# Patient Record
Sex: Female | Born: 1990
Health system: Southern US, Community
[De-identification: ages and names within clinical notes are randomized; demographics above are authoritative.]

## PROBLEM LIST (undated history)

## (undated) DIAGNOSIS — E119 Type 2 diabetes mellitus without complications: Secondary | ICD-10-CM

## (undated) DIAGNOSIS — I10 Essential (primary) hypertension: Secondary | ICD-10-CM

## (undated) HISTORY — PX: EYE SURGERY: SHX253

## (undated) HISTORY — PX: LEG SURGERY: SHX1003

## (undated) HISTORY — PX: FOOT SURGERY: SHX648

## (undated) HISTORY — PX: BRAIN SURGERY: SHX531

---
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
## 2011-06-30 ENCOUNTER — Ambulatory Visit (HOSPITAL_BASED_OUTPATIENT_CLINIC_OR_DEPARTMENT_OTHER)
Admission: RE | Admit: 2011-06-30 | Discharge: 2011-06-30 | Disposition: A | Discharge status: Home / Self Care | Payer: BC Managed Care – PPO | Source: Ambulatory Visit | Attending: Ophthalmology | Admitting: Ophthalmology

## 2011-06-30 DIAGNOSIS — H4900 Third [oculomotor] nerve palsy, unspecified eye: Secondary | ICD-10-CM | POA: Insufficient documentation

## 2011-06-30 DIAGNOSIS — IMO0002 Reserved for concepts with insufficient information to code with codable children: Secondary | ICD-10-CM | POA: Insufficient documentation

## 2011-06-30 DIAGNOSIS — Z01812 Encounter for preprocedural laboratory examination: Secondary | ICD-10-CM | POA: Insufficient documentation

## 2011-06-30 LAB — POCT HEMOGLOBIN-HEMACUE: Hemoglobin: 14 g/dL (ref 12.0–15.0)
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
## 2014-02-28 ENCOUNTER — Other Ambulatory Visit (HOSPITAL_COMMUNITY)
Admission: RE | Admit: 2014-02-28 | Discharge: 2014-02-28 | Disposition: A | Discharge status: Home / Self Care | Payer: Medicare Other | Source: Ambulatory Visit | Attending: Family Medicine | Admitting: Family Medicine

## 2014-02-28 DIAGNOSIS — N760 Acute vaginitis: Secondary | ICD-10-CM | POA: Insufficient documentation

## 2014-02-28 DIAGNOSIS — Z113 Encounter for screening for infections with a predominantly sexual mode of transmission: Secondary | ICD-10-CM | POA: Diagnosis not present

## 2014-02-28 DIAGNOSIS — Z124 Encounter for screening for malignant neoplasm of cervix: Secondary | ICD-10-CM | POA: Insufficient documentation

## 2014-03-22 DIAGNOSIS — N926 Irregular menstruation, unspecified: Secondary | ICD-10-CM | POA: Diagnosis not present

## 2014-03-22 DIAGNOSIS — N946 Dysmenorrhea, unspecified: Secondary | ICD-10-CM | POA: Diagnosis not present

## 2014-03-22 DIAGNOSIS — Z124 Encounter for screening for malignant neoplasm of cervix: Secondary | ICD-10-CM | POA: Diagnosis not present

## 2014-03-22 DIAGNOSIS — Z01419 Encounter for gynecological examination (general) (routine) without abnormal findings: Secondary | ICD-10-CM | POA: Diagnosis not present

## 2014-06-25 DIAGNOSIS — M549 Dorsalgia, unspecified: Secondary | ICD-10-CM | POA: Diagnosis not present

## 2014-06-25 DIAGNOSIS — N926 Irregular menstruation, unspecified: Secondary | ICD-10-CM | POA: Diagnosis not present

## 2014-07-22 DIAGNOSIS — N926 Irregular menstruation, unspecified: Secondary | ICD-10-CM | POA: Diagnosis not present

## 2014-07-22 DIAGNOSIS — M545 Low back pain, unspecified: Secondary | ICD-10-CM | POA: Diagnosis not present

## 2014-08-06 DIAGNOSIS — J309 Allergic rhinitis, unspecified: Secondary | ICD-10-CM | POA: Diagnosis not present

## 2014-08-06 DIAGNOSIS — R51 Headache: Secondary | ICD-10-CM | POA: Diagnosis not present

## 2014-08-06 DIAGNOSIS — M549 Dorsalgia, unspecified: Secondary | ICD-10-CM | POA: Diagnosis not present

## 2014-08-16 DIAGNOSIS — M999 Biomechanical lesion, unspecified: Secondary | ICD-10-CM | POA: Diagnosis not present

## 2014-08-16 DIAGNOSIS — M62838 Other muscle spasm: Secondary | ICD-10-CM | POA: Diagnosis not present

## 2014-08-30 DIAGNOSIS — M62830 Muscle spasm of back: Secondary | ICD-10-CM | POA: Diagnosis not present

## 2014-08-30 DIAGNOSIS — M545 Low back pain: Secondary | ICD-10-CM | POA: Diagnosis not present

## 2014-08-30 DIAGNOSIS — M9903 Segmental and somatic dysfunction of lumbar region: Secondary | ICD-10-CM | POA: Diagnosis not present

## 2014-08-30 DIAGNOSIS — M9902 Segmental and somatic dysfunction of thoracic region: Secondary | ICD-10-CM | POA: Diagnosis not present

## 2014-09-06 DIAGNOSIS — M545 Low back pain: Secondary | ICD-10-CM | POA: Diagnosis not present

## 2014-09-06 DIAGNOSIS — M9903 Segmental and somatic dysfunction of lumbar region: Secondary | ICD-10-CM | POA: Diagnosis not present

## 2014-09-06 DIAGNOSIS — M62830 Muscle spasm of back: Secondary | ICD-10-CM | POA: Diagnosis not present

## 2014-09-06 DIAGNOSIS — M9902 Segmental and somatic dysfunction of thoracic region: Secondary | ICD-10-CM | POA: Diagnosis not present

## 2014-09-30 DIAGNOSIS — J069 Acute upper respiratory infection, unspecified: Secondary | ICD-10-CM | POA: Diagnosis not present

## 2014-09-30 DIAGNOSIS — R05 Cough: Secondary | ICD-10-CM | POA: Diagnosis not present

## 2014-10-04 DIAGNOSIS — N920 Excessive and frequent menstruation with regular cycle: Secondary | ICD-10-CM | POA: Diagnosis not present

## 2014-10-04 DIAGNOSIS — N946 Dysmenorrhea, unspecified: Secondary | ICD-10-CM | POA: Diagnosis not present

## 2014-10-07 DIAGNOSIS — M9902 Segmental and somatic dysfunction of thoracic region: Secondary | ICD-10-CM | POA: Diagnosis not present

## 2014-10-07 DIAGNOSIS — M62830 Muscle spasm of back: Secondary | ICD-10-CM | POA: Diagnosis not present

## 2014-10-07 DIAGNOSIS — M9903 Segmental and somatic dysfunction of lumbar region: Secondary | ICD-10-CM | POA: Diagnosis not present

## 2014-10-07 DIAGNOSIS — M545 Low back pain: Secondary | ICD-10-CM | POA: Diagnosis not present

## 2014-10-22 DIAGNOSIS — J069 Acute upper respiratory infection, unspecified: Secondary | ICD-10-CM | POA: Diagnosis not present

## 2014-12-09 DIAGNOSIS — N926 Irregular menstruation, unspecified: Secondary | ICD-10-CM | POA: Diagnosis not present

## 2015-02-03 DIAGNOSIS — N926 Irregular menstruation, unspecified: Secondary | ICD-10-CM | POA: Diagnosis not present

## 2015-02-10 DIAGNOSIS — R112 Nausea with vomiting, unspecified: Secondary | ICD-10-CM | POA: Diagnosis not present

## 2015-02-13 DIAGNOSIS — R197 Diarrhea, unspecified: Secondary | ICD-10-CM | POA: Diagnosis not present

## 2015-04-08 DIAGNOSIS — N760 Acute vaginitis: Secondary | ICD-10-CM | POA: Diagnosis not present

## 2015-04-08 DIAGNOSIS — N762 Acute vulvitis: Secondary | ICD-10-CM | POA: Diagnosis not present

## 2015-04-30 ENCOUNTER — Other Ambulatory Visit: Payer: Self-pay | Admitting: Ophthalmology

## 2015-04-30 DIAGNOSIS — S020XXA Fracture of vault of skull, initial encounter for closed fracture: Secondary | ICD-10-CM

## 2015-04-30 DIAGNOSIS — S069X9A Unspecified intracranial injury with loss of consciousness of unspecified duration, initial encounter: Secondary | ICD-10-CM | POA: Diagnosis not present

## 2015-04-30 DIAGNOSIS — H4901 Third [oculomotor] nerve palsy, right eye: Secondary | ICD-10-CM

## 2015-04-30 DIAGNOSIS — H5347 Heteronymous bilateral field defects: Secondary | ICD-10-CM | POA: Diagnosis not present

## 2015-04-30 DIAGNOSIS — H5340 Unspecified visual field defects: Secondary | ICD-10-CM | POA: Diagnosis not present

## 2015-05-09 ENCOUNTER — Ambulatory Visit
Admission: RE | Admit: 2015-05-09 | Discharge: 2015-05-09 | Disposition: A | Discharge status: Home / Self Care | Payer: Medicare Other | Source: Ambulatory Visit | Attending: Ophthalmology | Admitting: Ophthalmology

## 2015-05-09 ENCOUNTER — Other Ambulatory Visit: Payer: Self-pay | Admitting: Ophthalmology

## 2015-05-09 DIAGNOSIS — H5347 Heteronymous bilateral field defects: Secondary | ICD-10-CM

## 2015-05-09 DIAGNOSIS — H4901 Third [oculomotor] nerve palsy, right eye: Secondary | ICD-10-CM | POA: Diagnosis not present

## 2015-05-09 DIAGNOSIS — S020XXA Fracture of vault of skull, initial encounter for closed fracture: Secondary | ICD-10-CM

## 2015-05-09 MED ORDER — GADOBENATE DIMEGLUMINE 529 MG/ML IV SOLN: 18.0000 mL | Freq: Once | INTRAVENOUS | Status: AC | PRN

## 2015-05-21 DIAGNOSIS — L84 Corns and callosities: Secondary | ICD-10-CM | POA: Diagnosis not present

## 2015-06-16 DIAGNOSIS — M79671 Pain in right foot: Secondary | ICD-10-CM | POA: Diagnosis not present

## 2015-06-16 DIAGNOSIS — D2371 Other benign neoplasm of skin of right lower limb, including hip: Secondary | ICD-10-CM | POA: Diagnosis not present

## 2015-06-16 DIAGNOSIS — M2011 Hallux valgus (acquired), right foot: Secondary | ICD-10-CM | POA: Diagnosis not present

## 2015-06-30 DIAGNOSIS — D2371 Other benign neoplasm of skin of right lower limb, including hip: Secondary | ICD-10-CM | POA: Diagnosis not present

## 2015-07-07 DIAGNOSIS — E119 Type 2 diabetes mellitus without complications: Secondary | ICD-10-CM | POA: Diagnosis not present

## 2015-07-07 DIAGNOSIS — M2041 Other hammer toe(s) (acquired), right foot: Secondary | ICD-10-CM | POA: Diagnosis not present

## 2015-07-07 DIAGNOSIS — M2011 Hallux valgus (acquired), right foot: Secondary | ICD-10-CM | POA: Diagnosis not present

## 2015-07-07 DIAGNOSIS — Z79899 Other long term (current) drug therapy: Secondary | ICD-10-CM | POA: Diagnosis not present

## 2015-08-12 DIAGNOSIS — N920 Excessive and frequent menstruation with regular cycle: Secondary | ICD-10-CM | POA: Diagnosis not present

## 2015-09-30 DIAGNOSIS — M2042 Other hammer toe(s) (acquired), left foot: Secondary | ICD-10-CM | POA: Diagnosis not present

## 2015-09-30 DIAGNOSIS — M2011 Hallux valgus (acquired), right foot: Secondary | ICD-10-CM | POA: Diagnosis not present

## 2015-09-30 DIAGNOSIS — M21621 Bunionette of right foot: Secondary | ICD-10-CM | POA: Diagnosis not present

## 2015-09-30 DIAGNOSIS — M2570 Osteophyte, unspecified joint: Secondary | ICD-10-CM | POA: Diagnosis not present

## 2015-09-30 DIAGNOSIS — E119 Type 2 diabetes mellitus without complications: Secondary | ICD-10-CM | POA: Diagnosis not present

## 2015-09-30 DIAGNOSIS — M2012 Hallux valgus (acquired), left foot: Secondary | ICD-10-CM | POA: Diagnosis not present

## 2015-09-30 DIAGNOSIS — M2041 Other hammer toe(s) (acquired), right foot: Secondary | ICD-10-CM | POA: Diagnosis not present

## 2015-09-30 DIAGNOSIS — M65872 Other synovitis and tenosynovitis, left ankle and foot: Secondary | ICD-10-CM | POA: Diagnosis not present

## 2015-10-03 DIAGNOSIS — M25571 Pain in right ankle and joints of right foot: Secondary | ICD-10-CM | POA: Diagnosis not present

## 2015-10-09 DIAGNOSIS — E663 Overweight: Secondary | ICD-10-CM | POA: Diagnosis not present

## 2015-10-09 DIAGNOSIS — R779 Abnormality of plasma protein, unspecified: Secondary | ICD-10-CM | POA: Diagnosis not present

## 2015-10-09 DIAGNOSIS — L2089 Other atopic dermatitis: Secondary | ICD-10-CM | POA: Diagnosis not present

## 2015-10-09 DIAGNOSIS — R7309 Other abnormal glucose: Secondary | ICD-10-CM | POA: Diagnosis not present

## 2015-10-13 DIAGNOSIS — Z01411 Encounter for gynecological examination (general) (routine) with abnormal findings: Secondary | ICD-10-CM | POA: Diagnosis not present

## 2015-10-13 DIAGNOSIS — Z124 Encounter for screening for malignant neoplasm of cervix: Secondary | ICD-10-CM | POA: Diagnosis not present

## 2015-10-13 DIAGNOSIS — N926 Irregular menstruation, unspecified: Secondary | ICD-10-CM | POA: Diagnosis not present

## 2015-10-13 DIAGNOSIS — B372 Candidiasis of skin and nail: Secondary | ICD-10-CM | POA: Diagnosis not present

## 2015-10-15 DIAGNOSIS — M25571 Pain in right ankle and joints of right foot: Secondary | ICD-10-CM | POA: Diagnosis not present

## 2015-10-20 DIAGNOSIS — L2089 Other atopic dermatitis: Secondary | ICD-10-CM | POA: Diagnosis not present

## 2015-10-29 DIAGNOSIS — M25571 Pain in right ankle and joints of right foot: Secondary | ICD-10-CM | POA: Diagnosis not present

## 2015-11-25 DIAGNOSIS — M21611 Bunion of right foot: Secondary | ICD-10-CM | POA: Diagnosis not present

## 2015-12-05 DIAGNOSIS — M21611 Bunion of right foot: Secondary | ICD-10-CM | POA: Diagnosis not present

## 2015-12-26 DIAGNOSIS — M21611 Bunion of right foot: Secondary | ICD-10-CM | POA: Diagnosis not present

## 2016-01-26 DIAGNOSIS — M21611 Bunion of right foot: Secondary | ICD-10-CM | POA: Diagnosis not present

## 2016-08-11 DIAGNOSIS — S069X3S Unspecified intracranial injury with loss of consciousness of 1 hour to 5 hours 59 minutes, sequela: Secondary | ICD-10-CM | POA: Diagnosis not present

## 2016-08-11 DIAGNOSIS — F338 Other recurrent depressive disorders: Secondary | ICD-10-CM | POA: Diagnosis not present

## 2016-08-11 DIAGNOSIS — F902 Attention-deficit hyperactivity disorder, combined type: Secondary | ICD-10-CM | POA: Diagnosis not present

## 2016-08-11 DIAGNOSIS — S069X9S Unspecified intracranial injury with loss of consciousness of unspecified duration, sequela: Secondary | ICD-10-CM | POA: Diagnosis not present

## 2016-08-11 DIAGNOSIS — R7309 Other abnormal glucose: Secondary | ICD-10-CM | POA: Diagnosis not present

## 2016-08-11 DIAGNOSIS — R799 Abnormal finding of blood chemistry, unspecified: Secondary | ICD-10-CM | POA: Diagnosis not present

## 2016-08-25 DIAGNOSIS — B372 Candidiasis of skin and nail: Secondary | ICD-10-CM | POA: Diagnosis not present

## 2016-08-25 DIAGNOSIS — N939 Abnormal uterine and vaginal bleeding, unspecified: Secondary | ICD-10-CM | POA: Diagnosis not present

## 2016-08-25 DIAGNOSIS — N649 Disorder of breast, unspecified: Secondary | ICD-10-CM | POA: Diagnosis not present

## 2016-09-01 DIAGNOSIS — F902 Attention-deficit hyperactivity disorder, combined type: Secondary | ICD-10-CM | POA: Diagnosis not present

## 2016-09-01 DIAGNOSIS — S069X3S Unspecified intracranial injury with loss of consciousness of 1 hour to 5 hours 59 minutes, sequela: Secondary | ICD-10-CM | POA: Diagnosis not present

## 2016-09-01 DIAGNOSIS — Z6841 Body Mass Index (BMI) 40.0 and over, adult: Secondary | ICD-10-CM | POA: Diagnosis not present

## 2016-09-01 DIAGNOSIS — R7309 Other abnormal glucose: Secondary | ICD-10-CM | POA: Diagnosis not present

## 2016-09-06 ENCOUNTER — Other Ambulatory Visit: Payer: Self-pay | Admitting: Pharmacist

## 2016-09-06 NOTE — Patient Outreach (Signed)
Outreach call to Kristina Murphy regarding her request for follow up from the [facility] Medication Adherence Campaign. Left a HIPAA compliant message on the patient's voicemail.   Harlow Asa, PharmD Clinical Pharmacist [facility] [phone]

## 2016-09-08 NOTE — Patient Outreach (Signed)
Receive a voicemail from Ms. Ronnald Ramp. Call Ms. Ardinger back to follow up. HIPAA identifiers verified and verbal consent received.  Ms. Withee reports that she takes her diabetes medication once daily as directed. Denies any missed doses or any barriers to taking her medications such as cost or side effects.   Patient reports that she has no medication questions or concerns at this time.  Harlow Asa, PharmD Clinical Pharmacist [facility] [phone]
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
## 2016-11-16 DIAGNOSIS — Z01411 Encounter for gynecological examination (general) (routine) with abnormal findings: Secondary | ICD-10-CM | POA: Diagnosis not present

## 2016-11-16 DIAGNOSIS — Z124 Encounter for screening for malignant neoplasm of cervix: Secondary | ICD-10-CM | POA: Diagnosis not present

## 2016-11-16 DIAGNOSIS — B372 Candidiasis of skin and nail: Secondary | ICD-10-CM | POA: Diagnosis not present

## 2016-11-16 DIAGNOSIS — Z6841 Body Mass Index (BMI) 40.0 and over, adult: Secondary | ICD-10-CM | POA: Diagnosis not present

## 2016-12-14 DIAGNOSIS — R69 Illness, unspecified: Secondary | ICD-10-CM | POA: Diagnosis not present

## 2016-12-20 DIAGNOSIS — M12271 Villonodular synovitis (pigmented), right ankle and foot: Secondary | ICD-10-CM | POA: Diagnosis not present

## 2016-12-20 DIAGNOSIS — M2011 Hallux valgus (acquired), right foot: Secondary | ICD-10-CM | POA: Diagnosis not present

## 2016-12-29 DIAGNOSIS — L308 Other specified dermatitis: Secondary | ICD-10-CM | POA: Diagnosis not present

## 2017-02-16 DIAGNOSIS — A599 Trichomoniasis, unspecified: Secondary | ICD-10-CM | POA: Diagnosis not present

## 2017-03-04 DIAGNOSIS — R69 Illness, unspecified: Secondary | ICD-10-CM | POA: Diagnosis not present

## 2017-03-16 DIAGNOSIS — A599 Trichomoniasis, unspecified: Secondary | ICD-10-CM | POA: Diagnosis not present

## 2017-04-20 DIAGNOSIS — S069X9A Unspecified intracranial injury with loss of consciousness of unspecified duration, initial encounter: Secondary | ICD-10-CM | POA: Diagnosis not present

## 2017-04-20 DIAGNOSIS — H02439 Paralytic ptosis unspecified eyelid: Secondary | ICD-10-CM | POA: Diagnosis not present

## 2017-04-20 DIAGNOSIS — H4901 Third [oculomotor] nerve palsy, right eye: Secondary | ICD-10-CM | POA: Diagnosis not present

## 2017-05-02 DIAGNOSIS — H5340 Unspecified visual field defects: Secondary | ICD-10-CM | POA: Diagnosis not present

## 2017-05-25 DIAGNOSIS — H4901 Third [oculomotor] nerve palsy, right eye: Secondary | ICD-10-CM | POA: Diagnosis not present

## 2017-05-25 DIAGNOSIS — S069X9A Unspecified intracranial injury with loss of consciousness of unspecified duration, initial encounter: Secondary | ICD-10-CM | POA: Diagnosis not present

## 2017-05-25 DIAGNOSIS — H02439 Paralytic ptosis unspecified eyelid: Secondary | ICD-10-CM | POA: Diagnosis not present

## 2017-05-25 DIAGNOSIS — H02431 Paralytic ptosis of right eyelid: Secondary | ICD-10-CM | POA: Diagnosis not present

## 2017-05-27 DIAGNOSIS — E663 Overweight: Secondary | ICD-10-CM | POA: Diagnosis not present

## 2017-05-27 DIAGNOSIS — R7309 Other abnormal glucose: Secondary | ICD-10-CM | POA: Diagnosis not present

## 2017-05-27 DIAGNOSIS — S069X9S Unspecified intracranial injury with loss of consciousness of unspecified duration, sequela: Secondary | ICD-10-CM | POA: Diagnosis not present

## 2017-05-27 DIAGNOSIS — H02401 Unspecified ptosis of right eyelid: Secondary | ICD-10-CM | POA: Diagnosis not present

## 2017-06-03 ENCOUNTER — Encounter (HOSPITAL_COMMUNITY): Payer: Self-pay | Admitting: Emergency Medicine

## 2017-06-03 ENCOUNTER — Emergency Department (HOSPITAL_COMMUNITY)
Admission: EM | Admit: 2017-06-03 | Discharge: 2017-06-03 | Disposition: A | Discharge status: Home / Self Care | Payer: Medicare HMO | Attending: Emergency Medicine | Admitting: Emergency Medicine

## 2017-06-03 DIAGNOSIS — E119 Type 2 diabetes mellitus without complications: Secondary | ICD-10-CM | POA: Insufficient documentation

## 2017-06-03 DIAGNOSIS — M25531 Pain in right wrist: Secondary | ICD-10-CM

## 2017-06-03 HISTORY — DX: Type 2 diabetes mellitus without complications: E11.9

## 2017-06-03 MED ORDER — IBUPROFEN 600 MG PO TABS: 600.0000 mg | ORAL_TABLET | Freq: Four times a day (QID) | ORAL | 0 refills | Status: DC | PRN

## 2017-06-03 MED ORDER — CYCLOBENZAPRINE HCL 10 MG PO TABS: 10.0000 mg | ORAL_TABLET | Freq: Two times a day (BID) | ORAL | 0 refills | Status: DC | PRN

## 2017-06-03 NOTE — Progress Notes (Signed)
Orthopedic Tech Progress Note Patient Details:  Kristina Murphy [birth year] [id number]  Ortho Devices Type of Ortho Device: Velcro wrist forearm splint Ortho Device/Splint Location: applied velcro wrist splint to pt right forearm wrist.  pt tolerated appllication well.   Right wrist/arm. Ortho Device/Splint Interventions: Application, Adjustment   [name] 06/03/2017, 8:43 PM

## 2017-06-03 NOTE — ED Provider Notes (Signed)
[facility] DEPT Provider Note   CSN: [id number] Arrival date & time: 06/03/17  1925     History   Chief Complaint Chief Complaint  Patient presents with  . Arm Pain    HPI Kristina Murphy is a 26 y.o. female.  HPI   26 year old female with history of borderline diabetes presenting complaining of right wrist and hand pain. Patient states for the past 2 days she has had gradual onset of throbbing pain to her wrist and radiates to the palm of hand, worsening with flexion of her wrist. Pain is moderate in intensity, worse at nighttime. She admits to doing a lot of typing and is right-hand dominant. States that the pain affect her ability to work. She denies any specific treatment tried. She denies history of DVT, and denies any specific injury.  Past Medical History:  Diagnosis Date  . Diabetes mellitus without complication ([place])     There are no active problems to display for this patient.   History reviewed. No pertinent surgical history.  OB History    No data available       Home Medications    Prior to Admission medications   Not on File    Family History No family history on file.  Social History Social History  Substance Use Topics  . Smoking status: Never Smoker  . Smokeless tobacco: Not on file  . Alcohol use No     Allergies   Coconut flavor   Review of Systems Review of Systems  Constitutional: Negative for fever.  Musculoskeletal: Positive for arthralgias.  Neurological: Negative for numbness.     Physical Exam Updated Vital Signs BP (!) 128/96 (BP Location: Left Arm)   Pulse 87   Temp 97.8 F (36.6 C) (Oral)   Resp 16   Ht 4\' 11"  (1.499 m)   Wt 97.5 kg (215 lb)   LMP 05/06/2017 (Approximate)   SpO2 100%   BMI 43.42 kg/m   Physical Exam  Constitutional: She appears well-developed and well-nourished. No distress.  Obese female in no acute discomfort  HENT:  Head: Atraumatic.  Eyes: Conjunctivae are normal.  Neck: Neck  supple.  Cardiovascular: Intact distal pulses.   Musculoskeletal: She exhibits tenderness (Right wrist: Mild diffuse tenderness on palpation of the wrist with normal wrist flexion and extension supination and pronation. Radial pulse 2+. Normal grip strength with normal range of motion throughout all fingers. No overlying skin changes.).  Neurological: She is alert.  Skin: No rash noted.  Psychiatric: She has a normal mood and affect.  Nursing note and vitals reviewed.    ED Treatments / Results  Labs (all labs ordered are listed, but only abnormal results are displayed) Labs Reviewed - No data to display  EKG  EKG Interpretation None       Radiology No results found.  Procedures Procedures (including critical care time)  Medications Ordered in ED Medications - No data to display   Initial Impression / Assessment and Plan / ED Course  I have reviewed the triage vital signs and the nursing notes.  Pertinent labs & imaging results that were available during my care of the patient were reviewed by me and considered in my medical decision making (see chart for details).     BP (!) 128/96 (BP Location: Left Arm)   Pulse 87   Temp 97.8 F (36.6 C) (Oral)   Resp 16   Ht 4\' 11"  (1.499 m)   Wt 97.5 kg (215 lb)  LMP 05/06/2017 (Approximate)   SpO2 100%   BMI 43.42 kg/m    Final Clinical Impressions(s) / ED Diagnoses   Final diagnoses:  Right wrist pain    New Prescriptions New Prescriptions   CYCLOBENZAPRINE (FLEXERIL) 10 MG TABLET    Take 1 tablet (10 mg total) by mouth 2 (two) times daily as needed for muscle spasms.   IBUPROFEN (ADVIL,MOTRIN) 600 MG TABLET    Take 1 tablet (600 mg total) by mouth every 6 (six) hours as needed.   8:12 PM Patient here with pain to her right wrist and radiates to her pain, suggestive of carpal tunnel syndrome. No focal weakness. No signs of infection. Her forearm compartment is soft. She is neurovascularly intact. She denies any  injury therefore I have low suspicion for acute fractures or dislocation. Will place her wrist in a Velcro wrist splint. Hand follow-up given as needed. Return precaution discussed.   Domenic Moras, PA-C 06/03/17 2017    Milton Ferguson, MD 06/03/17 [phone]

## 2017-06-03 NOTE — ED Triage Notes (Signed)
Pt reports "I think I broke my arm" States she woke up yesterday morning and her arm was hurting. No injury, no swelling, no bruising.

## 2017-06-13 DIAGNOSIS — N926 Irregular menstruation, unspecified: Secondary | ICD-10-CM | POA: Diagnosis not present

## 2017-06-13 DIAGNOSIS — Z6841 Body Mass Index (BMI) 40.0 and over, adult: Secondary | ICD-10-CM | POA: Diagnosis not present

## 2017-06-13 DIAGNOSIS — N946 Dysmenorrhea, unspecified: Secondary | ICD-10-CM | POA: Diagnosis not present

## 2017-06-14 DIAGNOSIS — R21 Rash and other nonspecific skin eruption: Secondary | ICD-10-CM | POA: Diagnosis not present

## 2017-06-14 DIAGNOSIS — H02401 Unspecified ptosis of right eyelid: Secondary | ICD-10-CM | POA: Diagnosis not present

## 2017-06-14 DIAGNOSIS — S069X9S Unspecified intracranial injury with loss of consciousness of unspecified duration, sequela: Secondary | ICD-10-CM | POA: Diagnosis not present

## 2017-06-14 DIAGNOSIS — R7303 Prediabetes: Secondary | ICD-10-CM | POA: Diagnosis not present

## 2017-06-21 DIAGNOSIS — S069X9S Unspecified intracranial injury with loss of consciousness of unspecified duration, sequela: Secondary | ICD-10-CM | POA: Diagnosis not present

## 2017-06-21 DIAGNOSIS — R21 Rash and other nonspecific skin eruption: Secondary | ICD-10-CM | POA: Diagnosis not present

## 2017-06-23 DIAGNOSIS — B86 Scabies: Secondary | ICD-10-CM | POA: Diagnosis not present

## 2017-06-23 DIAGNOSIS — R21 Rash and other nonspecific skin eruption: Secondary | ICD-10-CM | POA: Diagnosis not present

## 2017-07-05 DIAGNOSIS — R21 Rash and other nonspecific skin eruption: Secondary | ICD-10-CM | POA: Diagnosis not present

## 2017-07-22 DIAGNOSIS — R69 Illness, unspecified: Secondary | ICD-10-CM | POA: Diagnosis not present

## 2017-08-19 DIAGNOSIS — B353 Tinea pedis: Secondary | ICD-10-CM | POA: Diagnosis not present

## 2017-09-09 DIAGNOSIS — L2089 Other atopic dermatitis: Secondary | ICD-10-CM | POA: Diagnosis not present

## 2017-09-09 DIAGNOSIS — E663 Overweight: Secondary | ICD-10-CM | POA: Diagnosis not present

## 2017-09-09 DIAGNOSIS — S069X9S Unspecified intracranial injury with loss of consciousness of unspecified duration, sequela: Secondary | ICD-10-CM | POA: Diagnosis not present

## 2017-09-09 DIAGNOSIS — Z0000 Encounter for general adult medical examination without abnormal findings: Secondary | ICD-10-CM | POA: Diagnosis not present

## 2017-09-09 DIAGNOSIS — R7309 Other abnormal glucose: Secondary | ICD-10-CM | POA: Diagnosis not present

## 2018-01-24 ENCOUNTER — Encounter: Payer: Self-pay | Admitting: Dietician

## 2018-01-24 ENCOUNTER — Encounter: Payer: Medicare HMO | Attending: Family Medicine | Admitting: Dietician

## 2018-01-24 DIAGNOSIS — E669 Obesity, unspecified: Secondary | ICD-10-CM | POA: Diagnosis not present

## 2018-01-24 DIAGNOSIS — R7303 Prediabetes: Secondary | ICD-10-CM

## 2018-01-24 DIAGNOSIS — Z713 Dietary counseling and surveillance: Secondary | ICD-10-CM | POA: Insufficient documentation

## 2018-01-24 DIAGNOSIS — Z6841 Body Mass Index (BMI) 40.0 and over, adult: Secondary | ICD-10-CM

## 2018-01-24 NOTE — Progress Notes (Signed)
Diabetes Self-Management Education  Visit Type: First/Initial  Appt. Start Time: 1415 Appt. End Time: 0737  01/24/2018  Ms. Kristina Murphy, identified by name and date of birth, is a 27 y.o. female with a diagnosis of Diabetes: Pre-Diabetes. Patient states that Murphy father died of diabetes and that she wants to work to avoid this herself.   Weight: 227 lbs per patient today 228 lbs highest adult weight UBW 160-180 lbs prior to the [employer] and patient states that she gained weight afterward. This was put in at age 39.    Patient lives alone.  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  Murphy mother takes Murphy to work and she takes the bus home.  She was doing a lot of boredom/stress eating prior to getting this job.   She states that she is now learning to step away from the table and avoid eating out.  She struggles due to meal choices at work for lunch.   ASSESSMENT  Height _0  (1.499 m), weight 227 lb (103 kg). Body mass index is 45.85 kg/m.  Diabetes Self-Management Education - 01/24/18 1431      Visit Information   Visit Type  First/Initial      Initial Visit   Diabetes Type  Pre-Diabetes    Are you currently following a meal plan?  No    Are you taking your medications as prescribed?  Yes    Date Diagnosed  2012      Health Coping   How would you rate your overall health?  Good      Psychosocial Assessment   Patient Belief/Attitude about Diabetes  Motivated to manage diabetes    Self-care barriers  None    Self-management support  Doctor's office;Family    Other persons present  Patient    Patient Concerns  Nutrition/Meal planning;Glycemic Control;Weight Control    Special Needs  None    Preferred Learning Style  No preference indicated    Learning Readiness  Ready    How often do you need to have someone help you when you read instructions, pamphlets, or other written materials from your doctor or pharmacy?  1 - Never    What is  the last grade level you completed in school?  2 years college      Pre-Education Assessment   Patient understands the diabetes disease and treatment process.  Needs Review    Patient understands incorporating nutritional management into lifestyle.  Needs Review    Patient undertands incorporating physical activity into lifestyle.  Needs Review    Patient understands using medications safely.  Needs Review    Patient understands monitoring blood glucose, interpreting and using results  Needs Review    Patient understands prevention, detection, and treatment of acute complications.  Needs Review    Patient understands prevention, detection, and treatment of chronic complications.  Needs Review    Patient understands how to develop strategies to address psychosocial issues.  Needs Review    Patient understands how to develop strategies to promote health/change behavior.  Needs Review      Complications   Last HgB A1C per patient/outside source  5.9 % 08/2017    How often do you check your blood sugar?  Not recommended by provider    Have you had a dilated eye exam in the past 12 months?  Yes    Have you had a dental exam in the past 12 months?  Yes  Are you checking your feet?  Yes    How many days per week are you checking your feet?  7      Dietary Intake   Breakfast  skips OR fruited jello cup    Snack (morning)  none    Lunch  [place] OR fried chicken or baked chicken, macaroni and cheese or rice or mashed potatoes, roll or cornbread    Snack (afternoon)  none    Dinner  Oodles of noodles or bologna sandwich OR steak or shrimp, asparagus or broccoli, baked potato or mac and cheese    Snack (evening)  none    Beverage(s)  juice, regular soda (1 can every other day), water, rare sweet tea  b         Exercise   Exercise Type  Moderate (swimming / aerobic walking);Light (walking / raking leaves)    How many days per week to you exercise?  7    How many minutes per day do you  exercise?  30    Total minutes per week of exercise  210      Patient Education   Previous Diabetes Education  Yes (please comment) not formal    Disease state   Definition of diabetes, type 1 and 2, and the diagnosis of diabetes;Factors that contribute to the development of diabetes definition of prediabetes    Nutrition management   Food label reading, portion sizes and measuring food.;Role of diet in the treatment of diabetes and the relationship between the three main macronutrients and blood glucose level;Information on hints to eating out and maintain blood glucose control.;Meal options for control of blood glucose level and chronic complications.    Physical activity and exercise   Role of exercise on diabetes management, blood pressure control and cardiac health.    Medications  Reviewed patients medication for diabetes, action, purpose, timing of dose and side effects.    Psychosocial adjustment  Worked with patient to identify barriers to care and solutions;Identified and addressed patients feelings and concerns about diabetes    Personal strategies to promote health  Lifestyle issues that need to be addressed for better diabetes care      Individualized Goals (developed by patient)   Nutrition  General guidelines for healthy choices and portions discussed    Physical Activity  Exercise 5-7 days per week;30 minutes per day    Medications  take my medication as prescribed    Monitoring   Not Applicable    Reducing Risk  Other (comment) decrease unhealthy fat intake    Health Coping  discuss diabetes with (comment) MD, RD, CDE      Post-Education Assessment   Patient understands the diabetes disease and treatment process.  Demonstrates understanding / competency    Patient understands incorporating nutritional management into lifestyle.  Demonstrates understanding / competency    Patient undertands incorporating physical activity into lifestyle.  Demonstrates understanding / competency     Patient understands using medications safely.  Demonstrates understanding / competency    Patient understands monitoring blood glucose, interpreting and using results  Demonstrates understanding / competency    Patient understands prevention, detection, and treatment of acute complications.  Demonstrates understanding / competency    Patient understands prevention, detection, and treatment of chronic complications.  Demonstrates understanding / competency    Patient understands how to develop strategies to address psychosocial issues.  Demonstrates understanding / competency    Patient understands how to develop strategies to promote health/change behavior.  Needs  Review      Outcomes   Expected Outcomes  Demonstrated interest in learning. Expect positive outcomes    Future DMSE  PRN    Program Status  Completed       Individualized Plan for Diabetes Self-Management Training:   Learning Objective:  Patient will have a greater understanding of diabetes self-management. Patient education plan is to attend individual and/or group sessions per assessed needs and concerns.   Plan:   Patient Instructions  Continue to stay active.  Aim for at least 30 minutes most day. Consider choosing water or unsweetened tea rather than juice or regular soda. Continue to read the labels (portion size, fat, and carbohydrates) Bake or grill or boil rather than fry. Limit amounts of any fats. (remove chicken skin, choose lean meat) Continue to be mindful when you eat.  Stopping when you are satisfied. Eat more non starchy vegetables.      Expected Outcomes:  Demonstrated interest in learning. Expect positive outcomes  Education material provided: Food label handouts, A1C conversion sheet, Meal plan card, My Plate and Snack sheet  If problems or questions, patient to contact team via:  Phone  Future DSME appointment: PRN

## 2018-01-24 NOTE — Patient Instructions (Addendum)
Continue to stay active.  Aim for at least 30 minutes most day. Consider choosing water or unsweetened tea rather than juice or regular soda. Continue to read the labels (portion size, fat, and carbohydrates) Bake or grill or boil rather than fry. Limit amounts of any fats. (remove chicken skin, choose lean meat) Continue to be mindful when you eat.  Stopping when you are satisfied. Eat more non starchy vegetables.

## 2018-04-14 DIAGNOSIS — Z309 Encounter for contraceptive management, unspecified: Secondary | ICD-10-CM | POA: Diagnosis not present

## 2018-04-14 DIAGNOSIS — N926 Irregular menstruation, unspecified: Secondary | ICD-10-CM | POA: Diagnosis not present

## 2018-04-14 DIAGNOSIS — Z6841 Body Mass Index (BMI) 40.0 and over, adult: Secondary | ICD-10-CM | POA: Diagnosis not present

## 2018-04-14 DIAGNOSIS — B372 Candidiasis of skin and nail: Secondary | ICD-10-CM | POA: Diagnosis not present

## 2018-04-14 DIAGNOSIS — Z01411 Encounter for gynecological examination (general) (routine) with abnormal findings: Secondary | ICD-10-CM | POA: Diagnosis not present

## 2018-05-02 DIAGNOSIS — Z3049 Encounter for surveillance of other contraceptives: Secondary | ICD-10-CM | POA: Diagnosis not present

## 2018-05-02 DIAGNOSIS — Z309 Encounter for contraceptive management, unspecified: Secondary | ICD-10-CM | POA: Diagnosis not present

## 2018-07-10 DIAGNOSIS — Z309 Encounter for contraceptive management, unspecified: Secondary | ICD-10-CM | POA: Diagnosis not present

## 2018-08-21 DIAGNOSIS — H4901 Third [oculomotor] nerve palsy, right eye: Secondary | ICD-10-CM | POA: Diagnosis not present

## 2018-08-21 DIAGNOSIS — S069X9A Unspecified intracranial injury with loss of consciousness of unspecified duration, initial encounter: Secondary | ICD-10-CM | POA: Diagnosis not present

## 2018-08-21 DIAGNOSIS — H02439 Paralytic ptosis unspecified eyelid: Secondary | ICD-10-CM | POA: Diagnosis not present

## 2018-08-21 DIAGNOSIS — H4720 Unspecified optic atrophy: Secondary | ICD-10-CM | POA: Diagnosis not present

## 2018-09-01 DIAGNOSIS — M12272 Villonodular synovitis (pigmented), left ankle and foot: Secondary | ICD-10-CM | POA: Diagnosis not present

## 2018-09-01 DIAGNOSIS — M216X2 Other acquired deformities of left foot: Secondary | ICD-10-CM | POA: Diagnosis not present

## 2019-01-17 DIAGNOSIS — I10 Essential (primary) hypertension: Secondary | ICD-10-CM | POA: Diagnosis not present

## 2019-01-17 DIAGNOSIS — E1100 Type 2 diabetes mellitus with hyperosmolarity without nonketotic hyperglycemic-hyperosmolar coma (NKHHC): Secondary | ICD-10-CM | POA: Diagnosis not present

## 2019-02-08 DIAGNOSIS — R112 Nausea with vomiting, unspecified: Secondary | ICD-10-CM | POA: Diagnosis not present

## 2019-02-08 DIAGNOSIS — R21 Rash and other nonspecific skin eruption: Secondary | ICD-10-CM | POA: Diagnosis not present

## 2019-02-08 DIAGNOSIS — E663 Overweight: Secondary | ICD-10-CM | POA: Diagnosis not present

## 2019-02-08 DIAGNOSIS — R7303 Prediabetes: Secondary | ICD-10-CM | POA: Diagnosis not present

## 2019-02-15 DIAGNOSIS — R7303 Prediabetes: Secondary | ICD-10-CM | POA: Diagnosis not present

## 2019-02-15 DIAGNOSIS — K5289 Other specified noninfective gastroenteritis and colitis: Secondary | ICD-10-CM | POA: Diagnosis not present

## 2019-04-18 DIAGNOSIS — R7303 Prediabetes: Secondary | ICD-10-CM | POA: Diagnosis not present

## 2019-08-03 DIAGNOSIS — H4749 Disorders of optic chiasm in (due to) other disorders: Secondary | ICD-10-CM | POA: Diagnosis not present

## 2019-10-03 DIAGNOSIS — L853 Xerosis cutis: Secondary | ICD-10-CM | POA: Diagnosis not present

## 2019-10-24 DIAGNOSIS — L853 Xerosis cutis: Secondary | ICD-10-CM | POA: Diagnosis not present

## 2019-10-24 DIAGNOSIS — Z20828 Contact with and (suspected) exposure to other viral communicable diseases: Secondary | ICD-10-CM | POA: Diagnosis not present

## 2019-11-05 ENCOUNTER — Other Ambulatory Visit: Payer: Self-pay

## 2019-11-05 ENCOUNTER — Ambulatory Visit (HOSPITAL_COMMUNITY)
Admission: EM | Admit: 2019-11-05 | Discharge: 2019-11-05 | Disposition: A | Discharge status: Home / Self Care | Payer: Medicare HMO

## 2019-11-05 NOTE — ED Notes (Signed)
Patient here for COVID retesting after testing positive two weeks ago. Pt provided CDC recommendations for quarantine time and informed that retesting is not advised. Patient provided w/ a copy of the CDC recommendations for further referral. Patient discharged.

## 2019-11-06 DIAGNOSIS — E1100 Type 2 diabetes mellitus with hyperosmolarity without nonketotic hyperglycemic-hyperosmolar coma (NKHHC): Secondary | ICD-10-CM | POA: Diagnosis not present

## 2019-11-06 DIAGNOSIS — I10 Essential (primary) hypertension: Secondary | ICD-10-CM | POA: Diagnosis not present

## 2019-11-06 DIAGNOSIS — F338 Other recurrent depressive disorders: Secondary | ICD-10-CM | POA: Diagnosis not present

## 2019-11-06 DIAGNOSIS — F902 Attention-deficit hyperactivity disorder, combined type: Secondary | ICD-10-CM | POA: Diagnosis not present

## 2019-11-06 DIAGNOSIS — L2089 Other atopic dermatitis: Secondary | ICD-10-CM | POA: Diagnosis not present

## 2019-11-06 DIAGNOSIS — Z20828 Contact with and (suspected) exposure to other viral communicable diseases: Secondary | ICD-10-CM | POA: Diagnosis not present

## 2019-11-06 DIAGNOSIS — R779 Abnormality of plasma protein, unspecified: Secondary | ICD-10-CM | POA: Diagnosis not present

## 2019-12-04 DIAGNOSIS — L853 Xerosis cutis: Secondary | ICD-10-CM | POA: Diagnosis not present

## 2019-12-13 DIAGNOSIS — L700 Acne vulgaris: Secondary | ICD-10-CM | POA: Diagnosis not present

## 2020-01-08 ENCOUNTER — Other Ambulatory Visit: Payer: Self-pay

## 2020-01-08 ENCOUNTER — Ambulatory Visit (HOSPITAL_COMMUNITY)
Admission: EM | Admit: 2020-01-08 | Discharge: 2020-01-08 | Disposition: A | Discharge status: Home / Self Care | Payer: Medicare HMO | Attending: Family Medicine | Admitting: Family Medicine

## 2020-01-08 ENCOUNTER — Encounter (HOSPITAL_COMMUNITY): Payer: Self-pay

## 2020-01-08 DIAGNOSIS — Z20822 Contact with and (suspected) exposure to covid-19: Secondary | ICD-10-CM | POA: Insufficient documentation

## 2020-01-08 NOTE — Discharge Instructions (Signed)
You have been tested for COVID-19 today. °If your test returns positive, you will receive a phone call from [facility] regarding your results. °Negative test results are not called. °Both positive and negative results area always visible on MyChart. °If you do not have a MyChart account, sign up instructions are provided in your discharge papers. °Please do not hesitate to contact us should you have questions or concerns. ° °

## 2020-01-08 NOTE — ED Triage Notes (Signed)
Patient presents to Urgent Care with complaints of possible covid exposure since her mother was around someone w/ covid 10 days ago. Patient reports she has no sx.

## 2020-01-08 NOTE — ED Provider Notes (Signed)
[place]   [id number] 01/08/20 Arrival Time: [id number]  ASSESSMENT & PLAN:  1. Contact with and (suspected) exposure to covid-19      COVID-19 testing sent. See letter/work note on file for self-isolation guidelines.   Follow-up Information    Lucianne Lei, MD.   Specialty: Family Medicine Why: As needed. Contact information: [address]  [place] [address] [phone]           Reviewed expectations re: course of current medical issues. Questions answered. Outlined signs and symptoms indicating need for more acute intervention. Understanding verbalized. After Visit Summary given.   SUBJECTIVE: History from: patient. Kristina Murphy is a 29 y.o. female who requests COVID-19 testing. Known COVID-19 contact: reports possible exposure. Recent travel: none. Denies: runny nose, congestion, fever, cough, sore throat, difficulty breathing and headache. Normal PO intake without n/v/d.    OBJECTIVE:  Vitals:   01/08/20 1736  BP: 127/90  Pulse: 84  Resp: 16  Temp: 98.2 F (36.8 C)  TempSrc: Oral  SpO2: 99%    General appearance: alert; no distress Eyes: PERRLA; EOMI; conjunctiva normal HENT: [employer]; AT; nasal mucosa normal; oral mucosa normal Neck: supple  Lungs: speaks full sentences without difficulty; unlabored Extremities: no edema Skin: warm and dry Neurologic: normal gait Psychological: alert and cooperative; normal mood and affect  Labs:  Labs Reviewed  NOVEL CORONAVIRUS, NAA (HOSP ORDER, SEND-OUT TO REF LAB; TAT 18-24 HRS)      Allergies  Allergen Reactions  . Coconut Flavor Swelling    Past Medical History:  Diagnosis Date  . Diabetes mellitus without complication [facility])    Social History   Socioeconomic History  . Marital status: Single    Spouse name: Not on file  . Number of children: Not on file  . Years of education: Not on file  . Highest education level: Not on file  Occupational History  . Not on file  Tobacco  Use  . Smoking status: Never Smoker  . Smokeless tobacco: Never Used  Substance and Sexual Activity  . Alcohol use: No  . Drug use: No  . Sexual activity: Not on file  Other Topics Concern  . Not on file  Social History Narrative  . Not on file   Social Determinants of Health   Financial Resource Strain:   . Difficulty of Paying Living Expenses: Not on file  Food Insecurity:   . Worried About Charity fundraiser in the Last Year: Not on file  . Ran Out of Food in the Last Year: Not on file  Transportation Needs:   . Lack of Transportation (Medical): Not on file  . Lack of Transportation (Non-Medical): Not on file  Physical Activity:   . Days of Exercise per Week: Not on file  . Minutes of Exercise per Session: Not on file  Stress:   . Feeling of Stress : Not on file  Social Connections:   . Frequency of Communication with Friends and Family: Not on file  . Frequency of Social Gatherings with Friends and Family: Not on file  . Attends Religious Services: Not on file  . Active Member of Clubs or Organizations: Not on file  . Attends Archivist Meetings: Not on file  . Marital Status: Not on file  Intimate Partner Violence:   . Fear of Current or Ex-Partner: Not on file  . Emotionally Abused: Not on file  . Physically Abused: Not on file  . Sexually Abused: Not on  file   Family History  Problem Relation Age of Onset  . Healthy Mother    History reviewed. No pertinent surgical history.   Vanessa Kick, MD 01/09/20 [id number]

## 2020-01-10 LAB — NOVEL CORONAVIRUS, NAA (HOSP ORDER, SEND-OUT TO REF LAB; TAT 18-24 HRS): SARS-CoV-2, NAA: NOT DETECTED

## 2020-02-06 DIAGNOSIS — Z124 Encounter for screening for malignant neoplasm of cervix: Secondary | ICD-10-CM | POA: Diagnosis not present

## 2020-02-06 DIAGNOSIS — Z3040 Encounter for surveillance of contraceptives, unspecified: Secondary | ICD-10-CM | POA: Diagnosis not present

## 2020-02-06 DIAGNOSIS — Z01419 Encounter for gynecological examination (general) (routine) without abnormal findings: Secondary | ICD-10-CM | POA: Diagnosis not present

## 2020-04-08 DIAGNOSIS — N6310 Unspecified lump in the right breast, unspecified quadrant: Secondary | ICD-10-CM | POA: Diagnosis not present

## 2020-04-18 DIAGNOSIS — R928 Other abnormal and inconclusive findings on diagnostic imaging of breast: Secondary | ICD-10-CM | POA: Diagnosis not present

## 2020-04-18 DIAGNOSIS — N6310 Unspecified lump in the right breast, unspecified quadrant: Secondary | ICD-10-CM | POA: Diagnosis not present

## 2020-05-26 ENCOUNTER — Other Ambulatory Visit: Payer: Self-pay | Admitting: Surgery

## 2020-05-26 DIAGNOSIS — N6310 Unspecified lump in the right breast, unspecified quadrant: Secondary | ICD-10-CM | POA: Diagnosis not present

## 2020-09-17 DIAGNOSIS — E1100 Type 2 diabetes mellitus with hyperosmolarity without nonketotic hyperglycemic-hyperosmolar coma (NKHHC): Secondary | ICD-10-CM | POA: Diagnosis not present

## 2020-09-17 DIAGNOSIS — I10 Essential (primary) hypertension: Secondary | ICD-10-CM | POA: Diagnosis not present

## 2020-09-17 DIAGNOSIS — R7303 Prediabetes: Secondary | ICD-10-CM | POA: Diagnosis not present

## 2020-09-17 DIAGNOSIS — F902 Attention-deficit hyperactivity disorder, combined type: Secondary | ICD-10-CM | POA: Diagnosis not present

## 2020-09-17 DIAGNOSIS — E663 Overweight: Secondary | ICD-10-CM | POA: Diagnosis not present

## 2020-09-17 DIAGNOSIS — H02401 Unspecified ptosis of right eyelid: Secondary | ICD-10-CM | POA: Diagnosis not present

## 2020-09-17 DIAGNOSIS — R635 Abnormal weight gain: Secondary | ICD-10-CM | POA: Diagnosis not present

## 2021-06-10 DIAGNOSIS — Z03818 Encounter for observation for suspected exposure to other biological agents ruled out: Secondary | ICD-10-CM | POA: Diagnosis not present

## 2021-07-11 DIAGNOSIS — Z03818 Encounter for observation for suspected exposure to other biological agents ruled out: Secondary | ICD-10-CM | POA: Diagnosis not present

## 2021-07-11 DIAGNOSIS — Z20822 Contact with and (suspected) exposure to covid-19: Secondary | ICD-10-CM | POA: Diagnosis not present

## 2021-07-27 DIAGNOSIS — Z20822 Contact with and (suspected) exposure to covid-19: Secondary | ICD-10-CM | POA: Diagnosis not present

## 2021-08-05 DIAGNOSIS — Z01419 Encounter for gynecological examination (general) (routine) without abnormal findings: Secondary | ICD-10-CM | POA: Diagnosis not present

## 2021-08-05 DIAGNOSIS — Z3040 Encounter for surveillance of contraceptives, unspecified: Secondary | ICD-10-CM | POA: Diagnosis not present

## 2021-08-05 DIAGNOSIS — Z6841 Body Mass Index (BMI) 40.0 and over, adult: Secondary | ICD-10-CM | POA: Diagnosis not present

## 2021-08-05 DIAGNOSIS — R102 Pelvic and perineal pain: Secondary | ICD-10-CM | POA: Diagnosis not present

## 2021-09-09 DIAGNOSIS — L700 Acne vulgaris: Secondary | ICD-10-CM | POA: Diagnosis not present

## 2021-10-06 DIAGNOSIS — Z23 Encounter for immunization: Secondary | ICD-10-CM | POA: Diagnosis not present

## 2021-10-06 DIAGNOSIS — J029 Acute pharyngitis, unspecified: Secondary | ICD-10-CM | POA: Diagnosis not present

## 2021-10-06 DIAGNOSIS — Z20822 Contact with and (suspected) exposure to covid-19: Secondary | ICD-10-CM | POA: Diagnosis not present

## 2021-10-12 DIAGNOSIS — J09X9 Influenza due to identified novel influenza A virus with other manifestations: Secondary | ICD-10-CM | POA: Diagnosis not present

## 2021-10-12 DIAGNOSIS — J029 Acute pharyngitis, unspecified: Secondary | ICD-10-CM | POA: Diagnosis not present

## 2021-10-17 DIAGNOSIS — R070 Pain in throat: Secondary | ICD-10-CM | POA: Diagnosis not present

## 2021-10-17 DIAGNOSIS — J029 Acute pharyngitis, unspecified: Secondary | ICD-10-CM | POA: Diagnosis not present

## 2021-10-17 DIAGNOSIS — I10 Essential (primary) hypertension: Secondary | ICD-10-CM | POA: Diagnosis not present

## 2021-10-17 DIAGNOSIS — E663 Overweight: Secondary | ICD-10-CM | POA: Diagnosis not present

## 2021-11-25 DIAGNOSIS — L700 Acne vulgaris: Secondary | ICD-10-CM | POA: Diagnosis not present

## 2022-01-04 DIAGNOSIS — U071 COVID-19: Secondary | ICD-10-CM | POA: Diagnosis not present

## 2022-01-04 DIAGNOSIS — Z03818 Encounter for observation for suspected exposure to other biological agents ruled out: Secondary | ICD-10-CM | POA: Diagnosis not present

## 2022-01-06 DIAGNOSIS — R0981 Nasal congestion: Secondary | ICD-10-CM | POA: Diagnosis not present

## 2022-01-06 DIAGNOSIS — R059 Cough, unspecified: Secondary | ICD-10-CM | POA: Diagnosis not present

## 2022-01-06 DIAGNOSIS — R7303 Prediabetes: Secondary | ICD-10-CM | POA: Diagnosis not present

## 2022-01-06 DIAGNOSIS — U071 COVID-19: Secondary | ICD-10-CM | POA: Diagnosis not present

## 2022-01-08 DIAGNOSIS — Z03818 Encounter for observation for suspected exposure to other biological agents ruled out: Secondary | ICD-10-CM | POA: Diagnosis not present

## 2022-01-08 DIAGNOSIS — Z20828 Contact with and (suspected) exposure to other viral communicable diseases: Secondary | ICD-10-CM | POA: Diagnosis not present

## 2022-01-08 DIAGNOSIS — U071 COVID-19: Secondary | ICD-10-CM | POA: Diagnosis not present

## 2022-03-31 DIAGNOSIS — L700 Acne vulgaris: Secondary | ICD-10-CM | POA: Diagnosis not present

## 2023-01-19 DIAGNOSIS — Z01419 Encounter for gynecological examination (general) (routine) without abnormal findings: Secondary | ICD-10-CM | POA: Diagnosis not present

## 2023-01-19 DIAGNOSIS — N946 Dysmenorrhea, unspecified: Secondary | ICD-10-CM | POA: Diagnosis not present

## 2023-01-19 DIAGNOSIS — Z124 Encounter for screening for malignant neoplasm of cervix: Secondary | ICD-10-CM | POA: Diagnosis not present

## 2023-01-19 DIAGNOSIS — Z3040 Encounter for surveillance of contraceptives, unspecified: Secondary | ICD-10-CM | POA: Diagnosis not present

## 2023-08-10 DIAGNOSIS — Z6841 Body Mass Index (BMI) 40.0 and over, adult: Secondary | ICD-10-CM | POA: Diagnosis not present

## 2023-08-10 DIAGNOSIS — R059 Cough, unspecified: Secondary | ICD-10-CM | POA: Diagnosis not present

## 2023-08-10 DIAGNOSIS — U071 COVID-19: Secondary | ICD-10-CM | POA: Diagnosis not present

## 2023-08-14 DIAGNOSIS — Z20828 Contact with and (suspected) exposure to other viral communicable diseases: Secondary | ICD-10-CM | POA: Diagnosis not present

## 2023-08-14 DIAGNOSIS — R030 Elevated blood-pressure reading, without diagnosis of hypertension: Secondary | ICD-10-CM | POA: Diagnosis not present

## 2023-08-14 DIAGNOSIS — R051 Acute cough: Secondary | ICD-10-CM | POA: Diagnosis not present

## 2023-08-14 DIAGNOSIS — Z6841 Body Mass Index (BMI) 40.0 and over, adult: Secondary | ICD-10-CM | POA: Diagnosis not present

## 2023-09-21 DIAGNOSIS — Z23 Encounter for immunization: Secondary | ICD-10-CM | POA: Diagnosis not present

## 2023-10-19 DIAGNOSIS — Z20822 Contact with and (suspected) exposure to covid-19: Secondary | ICD-10-CM | POA: Diagnosis not present

## 2023-10-19 DIAGNOSIS — Z6841 Body Mass Index (BMI) 40.0 and over, adult: Secondary | ICD-10-CM | POA: Diagnosis not present

## 2023-11-07 DIAGNOSIS — Z202 Contact with and (suspected) exposure to infections with a predominantly sexual mode of transmission: Secondary | ICD-10-CM | POA: Diagnosis not present

## 2023-11-07 DIAGNOSIS — Z113 Encounter for screening for infections with a predominantly sexual mode of transmission: Secondary | ICD-10-CM | POA: Diagnosis not present

## 2023-11-16 DIAGNOSIS — E559 Vitamin D deficiency, unspecified: Secondary | ICD-10-CM | POA: Diagnosis not present

## 2023-11-16 DIAGNOSIS — E782 Mixed hyperlipidemia: Secondary | ICD-10-CM | POA: Diagnosis not present

## 2023-11-16 DIAGNOSIS — M25511 Pain in right shoulder: Secondary | ICD-10-CM | POA: Diagnosis not present

## 2023-11-16 DIAGNOSIS — K219 Gastro-esophageal reflux disease without esophagitis: Secondary | ICD-10-CM | POA: Diagnosis not present

## 2023-11-16 DIAGNOSIS — R000 Tachycardia, unspecified: Secondary | ICD-10-CM | POA: Diagnosis not present

## 2023-11-16 DIAGNOSIS — R7303 Prediabetes: Secondary | ICD-10-CM | POA: Diagnosis not present

## 2023-11-16 DIAGNOSIS — R5383 Other fatigue: Secondary | ICD-10-CM | POA: Diagnosis not present

## 2023-12-01 DIAGNOSIS — G4700 Insomnia, unspecified: Secondary | ICD-10-CM | POA: Diagnosis not present

## 2023-12-01 DIAGNOSIS — K219 Gastro-esophageal reflux disease without esophagitis: Secondary | ICD-10-CM | POA: Diagnosis not present

## 2023-12-01 DIAGNOSIS — E1169 Type 2 diabetes mellitus with other specified complication: Secondary | ICD-10-CM | POA: Diagnosis not present

## 2023-12-01 DIAGNOSIS — E559 Vitamin D deficiency, unspecified: Secondary | ICD-10-CM | POA: Diagnosis not present

## 2023-12-01 DIAGNOSIS — Z6841 Body Mass Index (BMI) 40.0 and over, adult: Secondary | ICD-10-CM | POA: Diagnosis not present

## 2023-12-01 DIAGNOSIS — E6609 Other obesity due to excess calories: Secondary | ICD-10-CM | POA: Diagnosis not present

## 2024-01-05 DIAGNOSIS — E1169 Type 2 diabetes mellitus with other specified complication: Secondary | ICD-10-CM | POA: Diagnosis not present

## 2024-01-05 DIAGNOSIS — E663 Overweight: Secondary | ICD-10-CM | POA: Diagnosis not present

## 2024-01-05 DIAGNOSIS — B3789 Other sites of candidiasis: Secondary | ICD-10-CM | POA: Diagnosis not present

## 2024-01-25 DIAGNOSIS — Z3040 Encounter for surveillance of contraceptives, unspecified: Secondary | ICD-10-CM | POA: Diagnosis not present

## 2024-01-25 DIAGNOSIS — Z113 Encounter for screening for infections with a predominantly sexual mode of transmission: Secondary | ICD-10-CM | POA: Diagnosis not present

## 2024-01-25 DIAGNOSIS — N912 Amenorrhea, unspecified: Secondary | ICD-10-CM | POA: Diagnosis not present

## 2024-01-25 DIAGNOSIS — Z01419 Encounter for gynecological examination (general) (routine) without abnormal findings: Secondary | ICD-10-CM | POA: Diagnosis not present

## 2024-02-03 DIAGNOSIS — Z3687 Encounter for antenatal screening for uncertain dates: Secondary | ICD-10-CM | POA: Diagnosis not present

## 2024-02-03 DIAGNOSIS — Z364 Encounter for antenatal screening for fetal growth retardation: Secondary | ICD-10-CM | POA: Diagnosis not present

## 2024-02-03 DIAGNOSIS — Z3A36 36 weeks gestation of pregnancy: Secondary | ICD-10-CM | POA: Diagnosis not present

## 2024-02-06 ENCOUNTER — Other Ambulatory Visit: Payer: Self-pay

## 2024-02-06 ENCOUNTER — Inpatient Hospital Stay (HOSPITAL_COMMUNITY)
Admission: EM | Admit: 2024-02-06 | Discharge: 2024-02-11 | DRG: 788 | Disposition: A | Discharge status: Home / Self Care | Attending: Obstetrics and Gynecology | Admitting: Obstetrics and Gynecology

## 2024-02-06 ENCOUNTER — Encounter (HOSPITAL_COMMUNITY): Payer: Self-pay

## 2024-02-06 DIAGNOSIS — O43893 Other placental disorders, third trimester: Secondary | ICD-10-CM | POA: Diagnosis not present

## 2024-02-06 DIAGNOSIS — O1414 Severe pre-eclampsia complicating childbirth: Principal | ICD-10-CM | POA: Diagnosis present

## 2024-02-06 DIAGNOSIS — Z3A36 36 weeks gestation of pregnancy: Secondary | ICD-10-CM | POA: Diagnosis not present

## 2024-02-06 DIAGNOSIS — E66813 Obesity, class 3: Secondary | ICD-10-CM | POA: Diagnosis present

## 2024-02-06 DIAGNOSIS — E119 Type 2 diabetes mellitus without complications: Secondary | ICD-10-CM | POA: Diagnosis not present

## 2024-02-06 DIAGNOSIS — Z23 Encounter for immunization: Secondary | ICD-10-CM

## 2024-02-06 DIAGNOSIS — O2412 Pre-existing diabetes mellitus, type 2, in childbirth: Secondary | ICD-10-CM | POA: Diagnosis not present

## 2024-02-06 DIAGNOSIS — Z3490 Encounter for supervision of normal pregnancy, unspecified, unspecified trimester: Secondary | ICD-10-CM | POA: Diagnosis not present

## 2024-02-06 DIAGNOSIS — R030 Elevated blood-pressure reading, without diagnosis of hypertension: Secondary | ICD-10-CM | POA: Diagnosis not present

## 2024-02-06 DIAGNOSIS — Z113 Encounter for screening for infections with a predominantly sexual mode of transmission: Secondary | ICD-10-CM | POA: Diagnosis not present

## 2024-02-06 DIAGNOSIS — O1413 Severe pre-eclampsia, third trimester: Secondary | ICD-10-CM | POA: Diagnosis present

## 2024-02-06 DIAGNOSIS — Z331 Pregnant state, incidental: Secondary | ICD-10-CM | POA: Diagnosis not present

## 2024-02-06 DIAGNOSIS — Z7984 Long term (current) use of oral hypoglycemic drugs: Secondary | ICD-10-CM

## 2024-02-06 DIAGNOSIS — O99214 Obesity complicating childbirth: Secondary | ICD-10-CM | POA: Diagnosis not present

## 2024-02-06 DIAGNOSIS — Z3A00 Weeks of gestation of pregnancy not specified: Secondary | ICD-10-CM | POA: Diagnosis not present

## 2024-02-06 DIAGNOSIS — Z3483 Encounter for supervision of other normal pregnancy, third trimester: Secondary | ICD-10-CM | POA: Diagnosis not present

## 2024-02-06 DIAGNOSIS — O119 Pre-existing hypertension with pre-eclampsia, unspecified trimester: Secondary | ICD-10-CM | POA: Diagnosis not present

## 2024-02-06 DIAGNOSIS — O1495 Unspecified pre-eclampsia, complicating the puerperium: Secondary | ICD-10-CM | POA: Diagnosis not present

## 2024-02-06 DIAGNOSIS — O99824 Streptococcus B carrier state complicating childbirth: Secondary | ICD-10-CM | POA: Diagnosis present

## 2024-02-06 LAB — CBC
HCT: 37.7 % (ref 36.0–46.0)
Hemoglobin: 12 g/dL (ref 12.0–15.0)
MCH: 25.8 pg — ABNORMAL LOW (ref 26.0–34.0)
MCHC: 31.8 g/dL (ref 30.0–36.0)
MCV: 80.9 fL (ref 80.0–100.0)
Platelets: 197 10*3/uL (ref 150–400)
RBC: 4.66 MIL/uL (ref 3.87–5.11)
RDW: 15.2 % (ref 11.5–15.5)
WBC: 12.9 10*3/uL — ABNORMAL HIGH (ref 4.0–10.5)
nRBC: 0 % (ref 0.0–0.2)

## 2024-02-06 LAB — PROTIME-INR
INR: 1 (ref 0.8–1.2)
Prothrombin Time: 13.1 s (ref 11.4–15.2)

## 2024-02-06 LAB — TYPE AND SCREEN
ABO/RH(D): B POS
Antibody Screen: NEGATIVE

## 2024-02-06 LAB — URINALYSIS, ROUTINE W REFLEX MICROSCOPIC
Bacteria, UA: NONE SEEN
Bilirubin Urine: NEGATIVE
Glucose, UA: NEGATIVE mg/dL
Ketones, ur: NEGATIVE mg/dL
Leukocytes,Ua: NEGATIVE
Nitrite: NEGATIVE
Protein, ur: NEGATIVE mg/dL
Specific Gravity, Urine: 1.02 (ref 1.005–1.030)
pH: 6 (ref 5.0–8.0)

## 2024-02-06 LAB — PROTEIN / CREATININE RATIO, URINE
Creatinine, Urine: 126 mg/dL
Protein Creatinine Ratio: 0.17 mg/mg{creat} — ABNORMAL HIGH (ref 0.00–0.15)
Total Protein, Urine: 21 mg/dL

## 2024-02-06 LAB — COMPREHENSIVE METABOLIC PANEL
ALT: 23 U/L (ref 0–44)
AST: 23 U/L (ref 15–41)
Albumin: 2.6 g/dL — ABNORMAL LOW (ref 3.5–5.0)
Alkaline Phosphatase: 158 U/L — ABNORMAL HIGH (ref 38–126)
Anion gap: 9 (ref 5–15)
BUN: 11 mg/dL (ref 6–20)
CO2: 21 mmol/L — ABNORMAL LOW (ref 22–32)
Calcium: 8.8 mg/dL — ABNORMAL LOW (ref 8.9–10.3)
Chloride: 108 mmol/L (ref 98–111)
Creatinine, Ser: 0.71 mg/dL (ref 0.44–1.00)
GFR, Estimated: >60 mL/min (ref >60)
Glucose, Bld: 97 mg/dL (ref 70–99)
Potassium: 3.7 mmol/L (ref 3.5–5.1)
Sodium: 138 mmol/L (ref 135–145)
Total Bilirubin: 0.3 mg/dL (ref 0.0–1.2)
Total Protein: 6.8 g/dL (ref 6.5–8.1)

## 2024-02-06 LAB — WET PREP, GENITAL
Clue Cells Wet Prep HPF POC: NONE SEEN
Sperm: NONE SEEN
Trich, Wet Prep: NONE SEEN
WBC, Wet Prep HPF POC: <10 (ref <10)
Yeast Wet Prep HPF POC: NONE SEEN

## 2024-02-06 LAB — CBC WITH DIFFERENTIAL/PLATELET
Abs Immature Granulocytes: 0.04 10*3/uL (ref 0.00–0.07)
Basophils Absolute: 0 10*3/uL (ref 0.0–0.1)
Basophils Relative: 0 %
Eosinophils Absolute: 0.1 10*3/uL (ref 0.0–0.5)
Eosinophils Relative: 0 %
HCT: 38.4 % (ref 36.0–46.0)
Hemoglobin: 12.1 g/dL (ref 12.0–15.0)
Immature Granulocytes: 0 %
Lymphocytes Relative: 19 %
Lymphs Abs: 2.3 10*3/uL (ref 0.7–4.0)
MCH: 26 pg (ref 26.0–34.0)
MCHC: 31.5 g/dL (ref 30.0–36.0)
MCV: 82.4 fL (ref 80.0–100.0)
Monocytes Absolute: 0.6 10*3/uL (ref 0.1–1.0)
Monocytes Relative: 5 %
Neutro Abs: 9.2 10*3/uL — ABNORMAL HIGH (ref 1.7–7.7)
Neutrophils Relative %: 76 %
Platelets: 195 10*3/uL (ref 150–400)
RBC: 4.66 MIL/uL (ref 3.87–5.11)
RDW: 15.1 % (ref 11.5–15.5)
WBC: 12.1 10*3/uL — ABNORMAL HIGH (ref 4.0–10.5)
nRBC: 0 % (ref 0.0–0.2)

## 2024-02-06 LAB — RAPID HIV SCREEN (HIV 1/2 AB+AG)
HIV 1/2 Antibodies: NONREACTIVE
HIV-1 P24 Antigen - HIV24: NONREACTIVE

## 2024-02-06 LAB — LACTATE DEHYDROGENASE: LDH: 134 U/L (ref 98–192)

## 2024-02-06 LAB — URIC ACID: Uric Acid, Serum: 4.3 mg/dL (ref 2.5–7.1)

## 2024-02-06 LAB — HEPATITIS B SURFACE ANTIGEN: Hepatitis B Surface Ag: NONREACTIVE

## 2024-02-06 LAB — GROUP B STREP BY PCR: Group B strep by PCR: POSITIVE — AB

## 2024-02-06 LAB — VITAMIN D 25 HYDROXY (VIT D DEFICIENCY, FRACTURES): Vit D, 25-Hydroxy: 9.16 ng/mL — ABNORMAL LOW (ref 30–100)

## 2024-02-06 LAB — HEMOGLOBIN A1C
Hgb A1c MFr Bld: 5.9 % — ABNORMAL HIGH (ref 4.8–5.6)
Mean Plasma Glucose: 122.63 mg/dL

## 2024-02-06 MED ORDER — LACTATED RINGERS IV SOLN: INTRAVENOUS | Status: AC

## 2024-02-06 MED ORDER — LABETALOL HCL 5 MG/ML IV SOLN: 80.0000 mg | INTRAVENOUS | Status: DC | PRN

## 2024-02-06 MED ORDER — LIDOCAINE HCL (PF) 1 % IJ SOLN: 30.0000 mL | INTRAMUSCULAR | Status: DC | PRN

## 2024-02-06 MED ORDER — FENTANYL CITRATE (PF) 100 MCG/2ML IJ SOLN
50.0000 ug | INTRAMUSCULAR | Status: DC | PRN
  Administered 2024-02-07: 50 ug via INTRAVENOUS
  Filled 2024-02-06: qty 2

## 2024-02-06 MED ORDER — OXYTOCIN BOLUS FROM INFUSION: 333.0000 mL | Freq: Once | INTRAVENOUS | Status: DC

## 2024-02-06 MED ORDER — ACETAMINOPHEN 325 MG PO TABS: 650.0000 mg | ORAL_TABLET | ORAL | Status: DC | PRN

## 2024-02-06 MED ORDER — OXYTOCIN-SODIUM CHLORIDE 30-0.9 UT/500ML-% IV SOLN: 2.5000 [IU]/h | INTRAVENOUS | Status: DC

## 2024-02-06 MED ORDER — MAGNESIUM SULFATE 40 GM/1000ML IV SOLN
0.5000 g/h | INTRAVENOUS | Status: AC
  Administered 2024-02-06: 1 g/h via INTRAVENOUS
  Administered 2024-02-07: 2 g/h via INTRAVENOUS
  Filled 2024-02-06 (×2): qty 1000

## 2024-02-06 MED ORDER — HYDRALAZINE HCL 20 MG/ML IJ SOLN: 10.0000 mg | INTRAMUSCULAR | Status: DC | PRN

## 2024-02-06 MED ORDER — OXYTOCIN-SODIUM CHLORIDE 30-0.9 UT/500ML-% IV SOLN
1.0000 m[IU]/min | INTRAVENOUS | Status: DC
  Administered 2024-02-08: 2 m[IU]/min via INTRAVENOUS
  Administered 2024-02-08: 1 m[IU]/min via INTRAVENOUS
  Filled 2024-02-06: qty 500

## 2024-02-06 MED ORDER — LACTATED RINGERS IV SOLN
500.0000 mL | INTRAVENOUS | Status: AC | PRN
  Administered 2024-02-07: 500 mL via INTRAVENOUS

## 2024-02-06 MED ORDER — MAGNESIUM SULFATE BOLUS VIA INFUSION
4.0000 g | Freq: Once | INTRAVENOUS | Status: AC
  Administered 2024-02-06: 4 g via INTRAVENOUS
  Filled 2024-02-06: qty 1000

## 2024-02-06 MED ORDER — LABETALOL HCL 5 MG/ML IV SOLN
20.0000 mg | INTRAVENOUS | Status: DC | PRN
  Administered 2024-02-06 – 2024-02-08 (×5): 20 mg via INTRAVENOUS
  Filled 2024-02-06 (×5): qty 4

## 2024-02-06 MED ORDER — ONDANSETRON HCL 4 MG/2ML IJ SOLN: 4.0000 mg | Freq: Four times a day (QID) | INTRAMUSCULAR | Status: DC | PRN

## 2024-02-06 MED ORDER — MAGNESIUM SULFATE BOLUS VIA INFUSION: 4.0000 g | Freq: Once | INTRAVENOUS | Status: DC

## 2024-02-06 MED ORDER — OXYCODONE-ACETAMINOPHEN 5-325 MG PO TABS: 2.0000 | ORAL_TABLET | ORAL | Status: DC | PRN

## 2024-02-06 MED ORDER — SOD CITRATE-CITRIC ACID 500-334 MG/5ML PO SOLN
30.0000 mL | ORAL | Status: DC | PRN
  Administered 2024-02-08: 30 mL via ORAL
  Filled 2024-02-06: qty 30

## 2024-02-06 MED ORDER — LABETALOL HCL 5 MG/ML IV SOLN
40.0000 mg | INTRAVENOUS | Status: DC | PRN
  Administered 2024-02-06 – 2024-02-08 (×2): 40 mg via INTRAVENOUS
  Filled 2024-02-06 (×2): qty 8

## 2024-02-06 MED ORDER — MISOPROSTOL 25 MCG QUARTER TABLET
25.0000 ug | ORAL_TABLET | ORAL | Status: AC | PRN
  Administered 2024-02-06 – 2024-02-07 (×3): 25 ug via VAGINAL
  Filled 2024-02-06 (×3): qty 1

## 2024-02-06 MED ORDER — FLEET ENEMA RE ENEM: 1.0000 | ENEMA | RECTAL | Status: DC | PRN

## 2024-02-06 MED ORDER — TERBUTALINE SULFATE 1 MG/ML IJ SOLN: 0.2500 mg | Freq: Once | INTRAMUSCULAR | Status: DC | PRN

## 2024-02-06 MED ORDER — PENICILLIN G POT IN DEXTROSE 60000 UNIT/ML IV SOLN
3.0000 10*6.[IU] | INTRAVENOUS | Status: DC
  Administered 2024-02-07 – 2024-02-08 (×8): 3 10*6.[IU] via INTRAVENOUS
  Filled 2024-02-06 (×8): qty 50

## 2024-02-06 MED ORDER — SODIUM CHLORIDE 0.9 % IV SOLN
5.0000 10*6.[IU] | Freq: Once | INTRAVENOUS | Status: AC
  Administered 2024-02-06: 5 10*6.[IU] via INTRAVENOUS
  Filled 2024-02-06: qty 5

## 2024-02-06 MED ORDER — OXYCODONE-ACETAMINOPHEN 5-325 MG PO TABS: 1.0000 | ORAL_TABLET | ORAL | Status: DC | PRN

## 2024-02-06 NOTE — ED Triage Notes (Signed)
 Pt reports she is 37w pregnant and found out 2 days ago. Had OB visit today and had [place] and pt blood pressure was high so OB sent her to the hospital. Pt then states she had a 21w appt during her pregnancy in February. Denies pain

## 2024-02-06 NOTE — ED Notes (Signed)
 [facility] has been notified and is on the way.

## 2024-02-06 NOTE — ED Provider Notes (Signed)
 [place] EMERGENCY DEPARTMENT AT [facility] Provider Note   CSN: [id number] Arrival date & time: 02/06/24  1658     History {Add pertinent medical, surgical, social history, OB history to HPI:1} Chief Complaint  Patient presents with   Hypertension    Kristina Murphy is a 33 y.o. female.  She is presenting here with complaint of elevated blood pressure and being [redacted] weeks pregnant.  She said she was at her OB office today Dr. Normand Sloop at [facility] in [place].  They told her to come to the hospital for admission.  She did not understand that she was post to go to women's.  She is here now.  Blood pressure has been elevated, unclear how long.  She is not having any headache chest pain blurry vision double vision or leg swelling.  The history is provided by the patient.  Hypertension This is a recurrent problem. Pertinent negatives include no chest pain, no abdominal pain, no headaches and no shortness of breath. Nothing aggravates the symptoms. Nothing relieves the symptoms. She has tried nothing for the symptoms. The treatment provided no relief.       Home Medications Prior to Admission medications   Medication Sig Start Date End Date Taking? Authorizing Provider  cyclobenzaprine (FLEXERIL) 10 MG tablet Take 1 tablet (10 mg total) by mouth 2 (two) times daily as needed for muscle spasms. Patient not taking: Reported on 01/24/2018 06/03/17   Fayrene Helper, PA-C  ibuprofen (ADVIL,MOTRIN) 600 MG tablet Take 1 tablet (600 mg total) by mouth every 6 (six) hours as needed. Patient not taking: Reported on 01/24/2018 06/03/17   Fayrene Helper, PA-C  metFORMIN (GLUCOPHAGE) 500 MG tablet Take 500 mg by mouth 2 (two) times daily with a meal.    [provider]      Allergies    Coconut flavoring agent (non-screening)    Review of Systems   Review of Systems  Respiratory:  Negative for shortness of breath.   Cardiovascular:  Negative for chest pain.  Gastrointestinal:   Negative for abdominal pain.  Neurological:  Negative for headaches.    Physical Exam Updated Vital Signs BP (!) 167/115   Pulse (!) 106   Temp 98.4 F (36.9 C) (Oral)   Resp 20   Ht 4\' 11"  (1.499 m)   Wt 103 kg   SpO2 99%   BMI 45.86 kg/m  Physical Exam Vitals and nursing note reviewed.  Constitutional:      General: She is not in acute distress.    Appearance: Normal appearance. She is well-developed. She is obese.  HENT:     Head: Normocephalic and atraumatic.  Eyes:     Conjunctiva/sclera: Conjunctivae normal.  Cardiovascular:     Rate and Rhythm: Regular rhythm. Tachycardia present.     Heart sounds: No murmur heard. Pulmonary:     Effort: Pulmonary effort is normal. No respiratory distress.     Breath sounds: Normal breath sounds.  Abdominal:     Palpations: Abdomen is soft.     Tenderness: There is no abdominal tenderness.  Musculoskeletal:     Cervical back: Neck supple.  Skin:    General: Skin is warm and dry.     Capillary Refill: Capillary refill takes less than 2 seconds.  Neurological:     General: No focal deficit present.     Mental Status: She is alert.     ED Results / Procedures / Treatments   Labs (all labs ordered are listed,  but only abnormal results are displayed) Labs Reviewed  COMPREHENSIVE METABOLIC PANEL  PROTEIN / CREATININE RATIO, URINE  URINALYSIS, ROUTINE W REFLEX MICROSCOPIC  CBC WITH DIFFERENTIAL/PLATELET  PROTIME-INR    EKG None  Radiology No results found.  Procedures Procedures  {Document cardiac monitor, telemetry assessment procedure when appropriate:1}  Medications Ordered in ED Medications  magnesium bolus via infusion 4 g (has no administration in time range)  magnesium sulfate 40 grams in SWI 1000 mL OB infusion (has no administration in time range)  labetalol (NORMODYNE) injection 20 mg (has no administration in time range)    And  labetalol (NORMODYNE) injection 40 mg (has no administration in time  range)    And  labetalol (NORMODYNE) injection 80 mg (has no administration in time range)    And  hydrALAZINE (APRESOLINE) injection 10 mg (has no administration in time range)    ED Course/ Medical Decision Making/ A&P   {   Click here for ABCD2, HEART and other calculatorsREFRESH Note before signing :1}                              Medical Decision Making Amount and/or Complexity of Data Reviewed Labs: ordered.  Risk Prescription drug management.   ***  {Document critical care time when appropriate:1} {Document review of labs and clinical decision tools ie heart score, Chads2Vasc2 etc:1}  {Document your independent review of radiology images, and any outside records:1} {Document your discussion with family members, caretakers, and with consultants:1} {Document social determinants of health affecting pt's care:1} {Document your decision making why or why not admission, treatments were needed:1} Final Clinical Impression(s) / ED Diagnoses Final diagnoses:  None    Rx / DC Orders ED Discharge Orders     None

## 2024-02-06 NOTE — MAU Note (Signed)
.  Kristina Murphy is a 33 y.o. at Unknown here in MAU reporting: arrived via [facility] at 2020 to room; patient on 2g mag continuous with 75 LR running in 20 g L forearm. Per report from RROB nurse; patient arrived to [facility] from [facility] office due to severe range Bps. Patient reports found out was pregnant last week, had first [facility] visit last week. In office today have severe range Bps. Patient reported to [facility] ED and was started on mag. Has received 20 and 40 of labetalol per report. Unsure of dating was told approx 37-39 weeks, at [place] today reports was told EDD of 03/01/2024.  Denies HA, RUQ pain, visual disturbances.  Denies VB, LOF, and reports less FM.    Onset of complaint: toda Pain score: 0/10 Vitals:   02/06/24 1815 02/06/24 1830  BP: (!) 159/111 (!) 170/113  Pulse: 98 100  Resp: (!) 26 12  Temp:    SpO2: 96% 96%     FHT: n/a  Lab orders placed from triage: n/a

## 2024-02-06 NOTE — H&P (Addendum)
 Kristina Murphy is a 32 y.o. female presenting for elevated BPs after being seen in the office today.  BPs in the severe range at [facility].  I asked them to start magnesium sulfate, check GHTN labs with PCR, start antihypertensive labetalol protocol and once stable transfer to MAU at [facility].  Called by Dia Sitter, CNM after she spoke with her attending Dr. Vergie Living who recommends IOL.   OB History   No obstetric history on file.    Past Medical History:  Diagnosis Date   Diabetes mellitus without complication (HCC)    History reviewed. No pertinent surgical history. Family History: family history includes Healthy in her mother. Social History:  reports that she has never smoked. She has never used smokeless tobacco. She reports that she does not drink alcohol and does not use drugs.     Maternal Diabetes: No Prenatal Care HgbA1C pending on admission and will order CBG. Genetic Screening: No PNC Maternal Ultrasounds/Referrals: Other:[facility]W on 02/03/24 6.1 lbs   Fetal Ultrasounds or other Referrals:  Other: None Maternal Substance Abuse:  No Significant Maternal Medications:  Meds include: Other: h/o metformin Significant Maternal Lab Results:  Other: No PMC Number of Prenatal Visits:Less than or equal to 3 verified prenatal visits Maternal Vaccinations:None documented Other Comments:  None  Review of Systems No F/C/N/V/D Denied Ha, visual changes or abdominal pain  History Dilation: Closed Effacement (%): Thick Station: Ballotable Exam by:: SNM Blood pressure (!) 155/106, pulse 99, temperature 98.4 F (36.9 C), temperature source Oral, resp. rate 12, height 4\' 11"  (1.499 m), weight 103 kg, SpO2 95%. Exam Physical Exam  Lungs unlabored CV RRR Abdomen gravid, NT Extremities no calf tenderness  FHT 140, +accels, no decels, mod variability Toco no contractions  Prenatal labs: ABO, Rh:  pending Antibody:  pending Rubella:  pending RPR:   pending HBsAg:   pending HIV:   pending GBS:    pending  Assessment/Plan: 32yo G1P0 at 24 4/7wks dated by a late third trimester ultrasound presenting with pre-eclampsia with severe features requiring labetalol protocol.  Plan cervical ripening.  Start abx for GBS unknown, status pending.  Pain medicine upon request.  Fetal status overall reassuring with cat 1 tracing.  PN labs ordered.  Bedside ultrasound per L&D protocol to confirm cephalic presentation.  Pt says she was prediabetic and after discussion with her, the FOB and the 2 grandmothers, they are undecided about placing the baby up for adoption.  Purcell Nails 02/06/2024, 8:49 PM  1st cytotec placed by RN at 2315

## 2024-02-06 NOTE — MAU Provider Note (Cosign Needed Addendum)
 History     CSN: [id number]  Arrival date and time: 02/06/24 1658   Event Date/Time   First Provider Initiated Contact with Patient 02/06/24 2050    Chief Complaint  Patient presents with   Hypertension   Hypertension   Ms. KYMBERLIE BRAZEAU is a 33 y.o. year old G1P0 female at Unknown weeks gestation who presents to MAU via carelink from [facility]. She reports elevated Bps in the office earlier today at [facility] and was told to go the hospital for BP check. Bps are still severe range despite 2 doses of labetalol in the ED, and is on 2g of Magnesium Sulfate. She reports less fetal movement, and denies VB and LOF. She denies any HA, vision changes, or epigastric pain. Reflexes are currently 2+, no clonus.   She has limited [facility], started on 01/20/2024 at [facility]. Her boyfriend is present and contributing to the history taking.    OB History     Gravida  1   Para      Term      Preterm      AB      Living         SAB      IAB      Ectopic      Multiple      Live Births              Past Medical History:  Diagnosis Date   Diabetes mellitus without complication (HCC)     Past Surgical History:  Procedure Laterality Date   BRAIN SURGERY     LEG SURGERY      Family History  Problem Relation Age of Onset   Healthy Mother     Social History   Tobacco Use   Smoking status: Never   Smokeless tobacco: Never  Vaping Use   Vaping status: Never Used  Substance Use Topics   Alcohol use: No   Drug use: No    Allergies:  Allergies  Allergen Reactions   Coconut Flavoring Agent (Non-Screening) Swelling    Medications Prior to Admission  Medication Sig Dispense Refill Last Dose/Taking   metFORMIN (GLUCOPHAGE) 500 MG tablet Take 500 mg by mouth 2 (two) times daily with a meal.   02/06/2024   Prenatal Vit-Fe Fumarate-FA (PRENATAL MULTIVITAMIN) TABS tablet Take 1 tablet by mouth daily at 12 noon.   Taking   cyclobenzaprine (FLEXERIL) 10  MG tablet Take 1 tablet (10 mg total) by mouth 2 (two) times daily as needed for muscle spasms. (Patient not taking: Reported on 01/24/2018) 20 tablet 0    ibuprofen (ADVIL,MOTRIN) 600 MG tablet Take 1 tablet (600 mg total) by mouth every 6 (six) hours as needed. (Patient not taking: Reported on 01/24/2018) 30 tablet 0     Review of Systems  Constitutional: Negative.   HENT: Negative.    Eyes: Negative.   Respiratory: Negative.    Gastrointestinal: Negative.   Genitourinary: Negative.   Neurological: Negative.    Physical Exam   Patient Vitals for the past 24 hrs:  BP Temp Temp src Pulse Resp SpO2 Height Weight  02/06/24 2130 (!) 146/95 -- -- 97 -- 96 % -- --  02/06/24 2125 -- -- -- -- -- 97 % -- --  02/06/24 2120 -- -- -- -- -- 96 % -- --  02/06/24 2115 (!) 149/93 -- -- 98 -- 96 % -- --  02/06/24 2055 -- -- -- -- -- 98 % -- --  02/06/24 2050 (!) 159/101 -- -- 96 -- 97 % -- --  02/06/24 2045 -- -- -- -- -- 98 % -- --  02/06/24 2040 -- -- -- -- -- 97 % -- --  02/06/24 2035 -- -- -- -- -- 95 % -- --  02/06/24 2030 -- -- -- -- -- 95 % -- --  02/06/24 1945 (!) 155/106 -- -- 99 -- 95 % -- --  02/06/24 1940 -- -- -- 94 -- 93 % -- --  02/06/24 1935 -- -- -- 94 -- 93 % -- --  02/06/24 1930 (!) 154/105 -- -- 98 -- 93 % -- --  02/06/24 1925 -- -- -- (!) 102 -- 96 % -- --  02/06/24 1920 (!) 157/106 -- -- 94 -- 93 % -- --  02/06/24 1915 (!) 156/109 -- -- 94 -- 94 % -- --  02/06/24 1910 (!) 170/116 -- -- 100 -- 96 % -- --  02/06/24 1905 -- -- -- 91 -- 93 % -- --  02/06/24 1900 (!) 160/110 -- -- 95 (!) 21 94 % -- --  02/06/24 1855 -- -- -- 98 (!) 30 91 % -- --  02/06/24 1850 (!) 150/105 -- -- 95 (!) 24 91 % -- --  02/06/24 1845 (!) 136/108 -- -- 96 (!) 30 92 % -- --  02/06/24 1840 (!) 172/120 -- -- (!) 104 -- 94 % -- --  02/06/24 1835 -- -- -- 99 -- 95 % -- --  02/06/24 1830 (!) 170/113 -- -- 100 12 96 % -- --  02/06/24 1825 -- -- -- 95 (!) 33 95 % -- --  02/06/24 1820 (!) 159/97 -- -- 93  (!) 28 95 % -- --  02/06/24 1815 (!) 159/111 -- -- 98 (!) 26 96 % -- --  02/06/24 1810 -- -- -- 100 (!) 31 96 % -- --  02/06/24 1805 (!) 161/113 -- -- 100 (!) 24 96 % -- --  02/06/24 1800 (!) 167/113 -- -- (!) 102 -- 96 % -- --  02/06/24 1755 (!) 163/103 -- -- 90 -- 97 % -- --  02/06/24 1745 (!) 167/115 -- -- (!) 106 -- 99 % -- --  02/06/24 1740 -- -- -- (!) 107 -- 99 % -- --  02/06/24 1735 -- -- -- (!) 101 -- 99 % -- --  02/06/24 1730 (!) 195/118 -- -- (!) 122 -- 99 % -- --  02/06/24 1725 -- -- -- 99 -- 99 % -- --  02/06/24 1720 -- -- -- (!) 106 -- 99 % 4\' 11"  (1.499 m) 103 kg  02/06/24 1715 (!) 157/129 -- -- (!) 108 -- 99 % -- --  02/06/24 1712 (!) 185/138 98.4 F (36.9 C) Oral (!) 107 20 100 % -- --  02/06/24 1710 (!) 185/141 -- -- (!) 111 -- 99 % -- --    Physical Exam Constitutional:      Appearance: Normal appearance.  HENT:     Head: Normocephalic.  Cardiovascular:     Rate and Rhythm: Normal rate and regular rhythm.     Pulses: Normal pulses.     Heart sounds: Normal heart sounds.  Pulmonary:     Breath sounds: Normal breath sounds.  Abdominal:     Palpations: Abdomen is soft.  Genitourinary:    Comments: Dilation: Closed Effacement (%): Thick Station: Ballotable Presentation: Undeterminable Exam by:: SNM  Musculoskeletal:     Right lower leg: Edema present.     Left lower  leg: Edema present.  Skin:    General: Skin is warm and dry.  Neurological:     Mental Status: She is alert and oriented to person, place, and time.  Psychiatric:        Behavior: Behavior normal.    REACTIVE NST - FHR: 140 bpm / moderate variability / accels present / decels absent / TOCO: none  MAU Course  Procedures  Patient informed that the ultrasound is considered a limited OB ultrasound and is not intended to be a complete ultrasound exam.  Patient also informed that the ultrasound is not being completed with the intent of assessing for fetal or placental anomalies or any pelvic  abnormalities.  Explained that the purpose of today's ultrasound is to assess for presentation.  Baby was found to be in a cephalic presentation. Patient acknowledges the purpose of the exam and the limitations of the study.  MDM Preeclampsia results from [place] reviewed. Discussed results with patient and possible need for IOL after talking to her physician.  *Consult with Dr. Vergie Living @ 2055 - notified of patient's complaints, assessments, lab & NST results, tx plan admit to L&D for IOL for PEC - agrees with plan  TC to Dr. Su Hilt to discuss recommendation for admission to L&D for IOL   Assessment and Plan  1. Preeclampsia complicating hypertension (Primary) - Admit to L&D  - Routine admission orders - Admission orders entered by Dr. Izora Gala, SNM 02/06/2024  9:02 PM   Attestation of Supervision of Student:  I confirm that I have verified the information documented in the nurse midwife student's note and that I have also personally reperformed the history, physical exam and all medical decision making activities.  I have verified that all services and findings are accurately documented in this student's note; and I agree with management and plan as outlined in the documentation. I have also made any necessary editorial changes.  Consulted with attending MD and on-call MD from [facility]. Informal bedside U/S done.  Raelyn Mora, CNM [facility] [employer], [facility] 02/06/2024 9:56 PM

## 2024-02-06 NOTE — Progress Notes (Signed)
 Arrived at [facility] ED . Pt was already started on 4 gm load of magnesium sulfate. Mainline iv added of LR at 75 cc/hr. Pt with severe range BP's with no symptoms. RN already drew labs. Added PCR. Reflexes 3+ with no clonus. No altered mental state. Pt states she just found out she is pregnant and initial ultrasound put her at 37 weeks. FHR reactive and reassuring. Plans confirmed with Dr Su Hilt to start Magnesium and draw labs and use the labetalol protocol. Then carelink to MAU. Then will decide which unit to admit to. Dr Vergie Living accepted care of patient.

## 2024-02-07 ENCOUNTER — Inpatient Hospital Stay (HOSPITAL_COMMUNITY): Admitting: Anesthesiology

## 2024-02-07 LAB — CULTURE, OB URINE: Culture: NO GROWTH

## 2024-02-07 LAB — COMPREHENSIVE METABOLIC PANEL
ALT: 27 U/L (ref 0–44)
ALT: 31 U/L (ref 0–44)
AST: 30 U/L (ref 15–41)
AST: 30 U/L (ref 15–41)
Albumin: 2.3 g/dL — ABNORMAL LOW (ref 3.5–5.0)
Albumin: 2.5 g/dL — ABNORMAL LOW (ref 3.5–5.0)
Alkaline Phosphatase: 137 U/L — ABNORMAL HIGH (ref 38–126)
Alkaline Phosphatase: 161 U/L — ABNORMAL HIGH (ref 38–126)
Anion gap: 8 (ref 5–15)
Anion gap: 9 (ref 5–15)
BUN: 6 mg/dL (ref 6–20)
BUN: 5 mg/dL — ABNORMAL LOW (ref 6–20)
CO2: 21 mmol/L — ABNORMAL LOW (ref 22–32)
CO2: 21 mmol/L — ABNORMAL LOW (ref 22–32)
Calcium: 7.4 mg/dL — ABNORMAL LOW (ref 8.9–10.3)
Calcium: 7.3 mg/dL — ABNORMAL LOW (ref 8.9–10.3)
Chloride: 106 mmol/L (ref 98–111)
Chloride: 101 mmol/L (ref 98–111)
Creatinine, Ser: 0.63 mg/dL (ref 0.44–1.00)
Creatinine, Ser: 0.8 mg/dL (ref 0.44–1.00)
GFR, Estimated: >60 mL/min (ref >60)
GFR, Estimated: >60 mL/min (ref >60)
Glucose, Bld: 114 mg/dL — ABNORMAL HIGH (ref 70–99)
Glucose, Bld: 165 mg/dL — ABNORMAL HIGH (ref 70–99)
Potassium: 3.7 mmol/L (ref 3.5–5.1)
Potassium: 4 mmol/L (ref 3.5–5.1)
Sodium: 135 mmol/L (ref 135–145)
Sodium: 131 mmol/L — ABNORMAL LOW (ref 135–145)
Total Bilirubin: 0.4 mg/dL (ref 0.0–1.2)
Total Bilirubin: 0.5 mg/dL (ref 0.0–1.2)
Total Protein: 6.3 g/dL — ABNORMAL LOW (ref 6.5–8.1)
Total Protein: 6.9 g/dL (ref 6.5–8.1)

## 2024-02-07 LAB — CBC
HCT: 38.1 % (ref 36.0–46.0)
HCT: 39.4 % (ref 36.0–46.0)
Hemoglobin: 12.5 g/dL (ref 12.0–15.0)
Hemoglobin: 12.7 g/dL (ref 12.0–15.0)
MCH: 26.1 pg (ref 26.0–34.0)
MCH: 25.8 pg — ABNORMAL LOW (ref 26.0–34.0)
MCHC: 32.8 g/dL (ref 30.0–36.0)
MCHC: 32.2 g/dL (ref 30.0–36.0)
MCV: 79.5 fL — ABNORMAL LOW (ref 80.0–100.0)
MCV: 79.9 fL — ABNORMAL LOW (ref 80.0–100.0)
Platelets: 181 10*3/uL (ref 150–400)
Platelets: 238 10*3/uL (ref 150–400)
RBC: 4.79 MIL/uL (ref 3.87–5.11)
RBC: 4.93 MIL/uL (ref 3.87–5.11)
RDW: 15.4 % (ref 11.5–15.5)
RDW: 15.3 % (ref 11.5–15.5)
WBC: 12 10*3/uL — ABNORMAL HIGH (ref 4.0–10.5)
WBC: 13.4 10*3/uL — ABNORMAL HIGH (ref 4.0–10.5)
nRBC: 0 % (ref 0.0–0.2)
nRBC: 0 % (ref 0.0–0.2)

## 2024-02-07 LAB — HEPATITIS C ANTIBODY: HCV Ab: NONREACTIVE

## 2024-02-07 LAB — RPR: RPR Ser Ql: NONREACTIVE

## 2024-02-07 LAB — URIC ACID: Uric Acid, Serum: 4.3 mg/dL (ref 2.5–7.1)

## 2024-02-07 LAB — MAGNESIUM
Magnesium: 5.4 mg/dL — ABNORMAL HIGH (ref 1.7–2.4)
Magnesium: 6.2 mg/dL (ref 1.7–2.4)

## 2024-02-07 LAB — GC/CHLAMYDIA PROBE AMP (~~LOC~~) NOT AT ARMC
Chlamydia: NEGATIVE
Comment: NEGATIVE
Comment: NORMAL
Neisseria Gonorrhea: NEGATIVE

## 2024-02-07 LAB — GLUCOSE, CAPILLARY
Glucose-Capillary: 111 mg/dL — ABNORMAL HIGH (ref 70–99)
Glucose-Capillary: 133 mg/dL — ABNORMAL HIGH (ref 70–99)

## 2024-02-07 LAB — LACTATE DEHYDROGENASE: LDH: 140 U/L (ref 98–192)

## 2024-02-07 MED ORDER — PHENYLEPHRINE 80 MCG/ML (10ML) SYRINGE FOR IV PUSH (FOR BLOOD PRESSURE SUPPORT): 80.0000 ug | PREFILLED_SYRINGE | INTRAVENOUS | Status: DC | PRN

## 2024-02-07 MED ORDER — MISOPROSTOL 50MCG HALF TABLET
50.0000 ug | ORAL_TABLET | ORAL | Status: DC
  Administered 2024-02-07 (×2): 50 ug via ORAL
  Filled 2024-02-07 (×4): qty 1

## 2024-02-07 MED ORDER — DIPHENHYDRAMINE HCL 50 MG/ML IJ SOLN
12.5000 mg | INTRAMUSCULAR | Status: DC | PRN
  Administered 2024-02-08: 12.5 mg via INTRAVENOUS
  Filled 2024-02-07: qty 1

## 2024-02-07 MED ORDER — LIDOCAINE HCL (PF) 1 % IJ SOLN
INTRAMUSCULAR | Status: DC | PRN
Start: 2024-02-07 — End: 2024-02-08
  Administered 2024-02-07: 5 mL via EPIDURAL

## 2024-02-07 MED ORDER — INSULIN ASPART 100 UNIT/ML IJ SOLN
0.0000 [IU] | INTRAMUSCULAR | Status: DC
  Administered 2024-02-08 (×2): 2 [IU] via SUBCUTANEOUS

## 2024-02-07 MED ORDER — EPHEDRINE 5 MG/ML INJ: 10.0000 mg | INTRAVENOUS | Status: DC | PRN

## 2024-02-07 MED ORDER — LACTATED RINGERS IV SOLN: 500.0000 mL | Freq: Once | INTRAVENOUS | Status: DC

## 2024-02-07 MED ORDER — FENTANYL-BUPIVACAINE-NACL 0.5-0.125-0.9 MG/250ML-% EP SOLN
12.0000 mL/h | EPIDURAL | Status: DC | PRN
  Administered 2024-02-07: 12 mL/h via EPIDURAL
  Filled 2024-02-07: qty 250

## 2024-02-07 MED ORDER — LIDOCAINE-EPINEPHRINE (PF) 1.5 %-1:200000 IJ SOLN
INTRAMUSCULAR | Status: DC | PRN
  Administered 2024-02-07: 5 mL via EPIDURAL

## 2024-02-07 NOTE — Progress Notes (Addendum)
 CSW received a consult for possible adoption. CSW met [name] at bedside on L&D to offer support and resources. CSW entered the room, introduced herself and acknowledged that her family was present. [name] gave CSW verbal permission to speak about anything while they were present. CSW explained her role and the reason for the visit.  CSW asked [name] if adoption plans were still being considered for the infant. [name] said yes; and reported that FOB was not present at the moment and they were still deciding if adoption was the correct decision for the infant. CSW informed [name] these resources that are being provided are just informational  on the different agencies and how the process would work; [name] was understanding.   CSW informed [name] once she has delivered the infant another assessment will be completed and further details could be discussed; [name] was understanding.   CSW provided a list of adoption agencies and several brochures that explain in further details of the process for adoption to [name] at bedside on L&D.  Enos Fling, Theresia Majors Clinical Social Worker [phone]

## 2024-02-07 NOTE — Progress Notes (Addendum)
 Kristina Murphy is a 33 y.o. G1P0 at [redacted]w[redacted]d admitted for induction of labor for preeclampsia with severe features.   Subjective:  Kristina Murphy denies any complaints. She is comfortable with epidural.   Objective: BP 133/75   Pulse 75   Temp 98.1 F (36.7 C) (Oral)   Resp 20   Ht 4\' 11"  (1.499 m)   Wt 103 kg   SpO2 95%   BMI 45.86 kg/m  I/O last 3 completed shifts: In: 3624.7 [P.O.:690; I.V.:2434.7; IV Piggyback:500] Out: 2750 [Urine:2750] No intake/output data recorded.    02/07/2024   10:05 PM 02/07/2024   10:00 PM 02/07/2024    9:55 PM  Vitals with BMI  Systolic 133 142 409  Diastolic 75 69 71  Pulse 75 75 75    FHT:  FHR: 130 bpm, variability: minimal ,  accelerations:  Present,  decelerations:  Present late UC:   none SVE:   Dilation: Closed Effacement (%): Thick Station: Ballotable Exam by:: MD Sallye Ober Decelerations started after epidural administration.  CVS: Normal rate and rhythm. Pulmonary: Normal respiratory effort, clear to auscultation bilaterally.  Extremities: Warm and well perfused, with 1+ edema in lower extremity bilaterally, no calf tenderness bilaterally.  Labs: Lab Results  Component Value Date   WBC 13.4 (H) 02/07/2024   HGB 12.7 02/07/2024   HCT 39.4 02/07/2024   MCV 79.9 (L) 02/07/2024   PLT 238 02/07/2024    Assessment / Plan: 33 y.o. G1P0 at [redacted]w[redacted]d admitted for induction of labor for preeclampsia with severe features,   Labor: S/p 3 vaginal cytotecs, 2 oral cytotecs. With unfavorable cervix. Plan for pitocin start in 1 hour if with normal fetal heart tracing. Discussed with Kristina Murphy management plan and she agrees with this.  Preeclampsia:  on magnesium sulfate, no symptoms of toxicity, intake and ouput balanced, and labs stable. Check magnesium level and CMP.  Fetal Wellbeing:  Category II, positional changes were done and Kristina Murphy received an IV fluid bolus of 500 cc LR.  Pain Control:  Labor support with Epidural.    I/D:   GBS positive and on  penicillin.  Anticipated MOD:  NSVD.   Prescilla Sours, MD 02/07/2024, 10:45 PM

## 2024-02-07 NOTE — Anesthesia Procedure Notes (Signed)
 Epidural Patient location during procedure: OB Start time: 02/07/2024 9:01 PM End time: 02/07/2024 9:21 PM  Staffing Anesthesiologist: Leonides Grills, MD Performed: anesthesiologist   Preanesthetic Checklist Completed: patient identified, IV checked, site marked, risks and benefits discussed, monitors and equipment checked, pre-op evaluation and timeout performed  Epidural Patient position: sitting Prep: DuraPrep Patient monitoring: heart rate, cardiac monitor, continuous pulse ox and blood pressure Approach: midline Location: L4-L5 Injection technique: LOR air  Needle:  Needle type: Tuohy  Needle gauge: 17 G Needle length: 9 cm Needle insertion depth: 8 cm Catheter type: closed end flexible Catheter size: 19 Gauge Catheter at skin depth: 14 cm Test dose: negative and 1.5% lidocaine with Epi 1:200 K  Assessment Events: blood not aspirated, no cerebrospinal fluid, injection not painful, no injection resistance and negative IV test  Additional Notes Informed consent obtained prior to proceeding including risk of failure, 2% risk of PDPH, risk of minor discomfort and bruising. Discussed alternatives to epidural analgesia and patient desires to proceed.  Timeout performed pre-procedure verifying patient name, procedure, and platelet count.  Loss of resistance encountered on the third attempt. Patient tolerated procedure well. Reason for block:procedure for pain

## 2024-02-07 NOTE — Progress Notes (Addendum)
 Kristina Murphy is a 33 y.o. G1P0 at [redacted]w[redacted]d admitted for induction of labor for preeclampsia with severe features.  Subjective:  Patient denies any complaints.  Objective: BP (!) 145/96   Pulse 91   Temp 98.2 F (36.8 C) (Oral)   Resp 20   Ht 4\' 11"  (1.499 m)   Wt 103 kg   SpO2 97%   BMI 45.86 kg/m  I/O last 3 completed shifts: In: 1744.7 [P.O.:240; I.V.:1201.4; IV Piggyback:303.3] Out: 950 [Urine:950] Total I/O In: 723.1 [P.O.:210; I.V.:466.4; IV Piggyback:46.7] Out: 600 [Urine:600] CVS: Normal rate. Pulmonary: Normal respiratory effort.  Extremities: Warm and well perfused, 1+ edema bilaterally in lower extremities, no calf tenderness.  FHT:  FHR: 130 bpm, variability: moderate,  accelerations:  Present,  decelerations:  Absent UC:   none SVE:   Dilation: Closed Effacement (%): Thick Station: Ballotable Exam by:: [employer]  Labs: Lab Results  Component Value Date   WBC 12.0 (H) 02/07/2024   HGB 12.5 02/07/2024   HCT 38.1 02/07/2024   MCV 79.5 (L) 02/07/2024   PLT 181 02/07/2024    Assessment / Plan: 33 y.o. G1P0 at [redacted]w[redacted]d admitted for induction of labor for preeclampsia with severe features,  Labor: S/p 3 vaginal cytotecs, plan for oral cytotec if continues with unfavorable cervix.  Preeclampsia:  on magnesium sulfate, no signs or symptoms of toxicity, intake and ouput balanced, and labs stable Fetal Wellbeing:  Category I Pain Control:  Labor support without medications, Epidural, IV pain meds, and Nitrous Oxide as desired.  I/D:   GBS positive and on penicillin.  Anticipated MOD:  NSVD.   Prescilla Sours, MD 02/07/2024, 12:00 PM.

## 2024-02-07 NOTE — Progress Notes (Addendum)
 RN and RROB Lovenia Shuck, RN feel no clonus when rapidly dorsiflex pt's foot. However, when performing DTR of patella on right side as leg returns to resting 3 beats are visualized. DTR on right is +3, DT on left is +2, no beats visualized on left side with DTR. Feet are taut. Dr. Su Hilt notified of observance and states "only count clonus from how we normally check for clonus" (rapidly dorsiflex patient's foot).

## 2024-02-07 NOTE — Anesthesia Preprocedure Evaluation (Signed)
 Anesthesia Evaluation  Patient identified by MRN, date of birth, ID band Patient awake    Reviewed: Allergy & Precautions, H&P , NPO status , Patient's Chart, lab work & pertinent test results  History of Anesthesia Complications Negative for: history of anesthetic complications  Airway Mallampati: II       Dental no notable dental hx.    Pulmonary neg pulmonary ROS   Pulmonary exam normal        Cardiovascular negative cardio ROS Normal cardiovascular exam     Neuro/Psych negative neurological ROS  negative psych ROS   GI/Hepatic negative GI ROS, Neg liver ROS,,,  Endo/Other  diabetes, Oral Hypoglycemic Agents  Class 3 obesity  Renal/GU negative Renal ROS  negative genitourinary   Musculoskeletal   Abdominal  (+) + obese  Peds  Hematology negative hematology ROS (+)   Anesthesia Other Findings Severe Pre-E  Reproductive/Obstetrics (+) Pregnancy                             Anesthesia Physical Anesthesia Plan  ASA: 3  Anesthesia Plan: Epidural   Post-op Pain Management:    Induction:   PONV Risk Score and Plan:   Airway Management Planned:   Additional Equipment:   Intra-op Plan:   Post-operative Plan:   Informed Consent: I have reviewed the patients History and Physical, chart, labs and discussed the procedure including the risks, benefits and alternatives for the proposed anesthesia with the patient or authorized representative who has indicated his/her understanding and acceptance.       Plan Discussed with:   Anesthesia Plan Comments:        Anesthesia Quick Evaluation

## 2024-02-07 NOTE — Progress Notes (Signed)
 This RN assessed clonus on right foot while pt was laying on left side. Foot had sustained twitching for about 5-10secs. Pt reported that her foot has done that her entire life if in certain positions. RN called RROB Lovenia Shuck, RN to room we positioned pt supine and assessed clonus again. No beats of clonus present.

## 2024-02-08 ENCOUNTER — Encounter (HOSPITAL_COMMUNITY): Payer: Self-pay | Admitting: Obstetrics and Gynecology

## 2024-02-08 ENCOUNTER — Encounter (HOSPITAL_COMMUNITY)
Admission: EM | Disposition: A | Discharge status: Home / Self Care | Payer: Self-pay | Source: Home / Self Care | Attending: Obstetrics and Gynecology

## 2024-02-08 DIAGNOSIS — O1414 Severe pre-eclampsia complicating childbirth: Secondary | ICD-10-CM

## 2024-02-08 DIAGNOSIS — Z3A00 Weeks of gestation of pregnancy not specified: Secondary | ICD-10-CM

## 2024-02-08 DIAGNOSIS — O119 Pre-existing hypertension with pre-eclampsia, unspecified trimester: Secondary | ICD-10-CM | POA: Diagnosis not present

## 2024-02-08 DIAGNOSIS — Z3490 Encounter for supervision of normal pregnancy, unspecified, unspecified trimester: Secondary | ICD-10-CM | POA: Diagnosis not present

## 2024-02-08 DIAGNOSIS — Z3A36 36 weeks gestation of pregnancy: Secondary | ICD-10-CM

## 2024-02-08 LAB — CBC
HCT: 39 % (ref 36.0–46.0)
HCT: 35.9 % — ABNORMAL LOW (ref 36.0–46.0)
HCT: 35.4 % — ABNORMAL LOW (ref 36.0–46.0)
Hemoglobin: 12.5 g/dL (ref 12.0–15.0)
Hemoglobin: 11.5 g/dL — ABNORMAL LOW (ref 12.0–15.0)
Hemoglobin: 11.4 g/dL — ABNORMAL LOW (ref 12.0–15.0)
MCH: 25.8 pg — ABNORMAL LOW (ref 26.0–34.0)
MCH: 26.1 pg (ref 26.0–34.0)
MCH: 26.3 pg (ref 26.0–34.0)
MCHC: 32.1 g/dL (ref 30.0–36.0)
MCHC: 32 g/dL (ref 30.0–36.0)
MCHC: 32.2 g/dL (ref 30.0–36.0)
MCV: 80.6 fL (ref 80.0–100.0)
MCV: 81.6 fL (ref 80.0–100.0)
MCV: 81.6 fL (ref 80.0–100.0)
Platelets: 198 10*3/uL (ref 150–400)
Platelets: 192 10*3/uL (ref 150–400)
Platelets: 214 10*3/uL (ref 150–400)
RBC: 4.84 MIL/uL (ref 3.87–5.11)
RBC: 4.4 MIL/uL (ref 3.87–5.11)
RBC: 4.34 MIL/uL (ref 3.87–5.11)
RDW: 15.4 % (ref 11.5–15.5)
RDW: 15.6 % — ABNORMAL HIGH (ref 11.5–15.5)
RDW: 15.5 % (ref 11.5–15.5)
WBC: 11.6 10*3/uL — ABNORMAL HIGH (ref 4.0–10.5)
WBC: 19.4 10*3/uL — ABNORMAL HIGH (ref 4.0–10.5)
WBC: 15.7 10*3/uL — ABNORMAL HIGH (ref 4.0–10.5)
nRBC: 0 % (ref 0.0–0.2)
nRBC: 0 % (ref 0.0–0.2)
nRBC: 0 % (ref 0.0–0.2)

## 2024-02-08 LAB — COMPREHENSIVE METABOLIC PANEL
ALT: 26 U/L (ref 0–44)
ALT: 22 U/L (ref 0–44)
AST: 27 U/L (ref 15–41)
AST: 24 U/L (ref 15–41)
Albumin: 2.2 g/dL — ABNORMAL LOW (ref 3.5–5.0)
Albumin: 2.1 g/dL — ABNORMAL LOW (ref 3.5–5.0)
Alkaline Phosphatase: 151 U/L — ABNORMAL HIGH (ref 38–126)
Alkaline Phosphatase: 140 U/L — ABNORMAL HIGH (ref 38–126)
Anion gap: 8 (ref 5–15)
Anion gap: 11 (ref 5–15)
BUN: 7 mg/dL (ref 6–20)
BUN: 8 mg/dL (ref 6–20)
CO2: 24 mmol/L (ref 22–32)
CO2: 21 mmol/L — ABNORMAL LOW (ref 22–32)
Calcium: 7.7 mg/dL — ABNORMAL LOW (ref 8.9–10.3)
Calcium: 7.6 mg/dL — ABNORMAL LOW (ref 8.9–10.3)
Chloride: 103 mmol/L (ref 98–111)
Chloride: 103 mmol/L (ref 98–111)
Creatinine, Ser: 1.15 mg/dL — ABNORMAL HIGH (ref 0.44–1.00)
Creatinine, Ser: 0.95 mg/dL (ref 0.44–1.00)
GFR, Estimated: >60 mL/min (ref >60)
GFR, Estimated: >60 mL/min (ref >60)
Glucose, Bld: 85 mg/dL (ref 70–99)
Glucose, Bld: 202 mg/dL — ABNORMAL HIGH (ref 70–99)
Potassium: 4 mmol/L (ref 3.5–5.1)
Potassium: 4.2 mmol/L (ref 3.5–5.1)
Sodium: 135 mmol/L (ref 135–145)
Sodium: 135 mmol/L (ref 135–145)
Total Bilirubin: 0.4 mg/dL (ref 0.0–1.2)
Total Bilirubin: 0.3 mg/dL (ref 0.0–1.2)
Total Protein: 6.5 g/dL (ref 6.5–8.1)
Total Protein: 5.9 g/dL — ABNORMAL LOW (ref 6.5–8.1)

## 2024-02-08 LAB — RUBELLA SCREEN: Rubella: 2.34 {index} (ref >0.99)

## 2024-02-08 LAB — GLUCOSE, CAPILLARY
Glucose-Capillary: 126 mg/dL — ABNORMAL HIGH (ref 70–99)
Glucose-Capillary: 127 mg/dL — ABNORMAL HIGH (ref 70–99)
Glucose-Capillary: 139 mg/dL — ABNORMAL HIGH (ref 70–99)
Glucose-Capillary: 87 mg/dL (ref 70–99)

## 2024-02-08 LAB — MAGNESIUM
Magnesium: 5.3 mg/dL — ABNORMAL HIGH (ref 1.7–2.4)
Magnesium: 4.6 mg/dL — ABNORMAL HIGH (ref 1.7–2.4)

## 2024-02-08 SURGERY — Surgical Case
Anesthesia: Epidural

## 2024-02-08 MED ORDER — NALOXONE HCL 4 MG/10ML IJ SOLN: 1.0000 ug/kg/h | INTRAVENOUS | Status: DC | PRN

## 2024-02-08 MED ORDER — WITCH HAZEL-GLYCERIN EX PADS: 1.0000 | MEDICATED_PAD | CUTANEOUS | Status: DC | PRN

## 2024-02-08 MED ORDER — ACETAMINOPHEN 500 MG PO TABS: 1000.0000 mg | ORAL_TABLET | Freq: Four times a day (QID) | ORAL | Status: DC

## 2024-02-08 MED ORDER — CEFAZOLIN SODIUM-DEXTROSE 2-3 GM-%(50ML) IV SOLR
INTRAVENOUS | Status: DC | PRN
  Administered 2024-02-08: 2 g via INTRAVENOUS

## 2024-02-08 MED ORDER — OXYTOCIN-SODIUM CHLORIDE 30-0.9 UT/500ML-% IV SOLN: 2.5000 [IU]/h | INTRAVENOUS | Status: AC

## 2024-02-08 MED ORDER — KETOROLAC TROMETHAMINE 30 MG/ML IJ SOLN
30.0000 mg | Freq: Four times a day (QID) | INTRAMUSCULAR | Status: DC | PRN
Start: 2024-02-08 — End: 2024-02-08

## 2024-02-08 MED ORDER — LABETALOL HCL 5 MG/ML IV SOLN
INTRAVENOUS | Status: AC
Start: 2024-02-08 — End: ?
  Filled 2024-02-08: qty 4

## 2024-02-08 MED ORDER — KETOROLAC TROMETHAMINE 30 MG/ML IJ SOLN: 30.0000 mg | Freq: Four times a day (QID) | INTRAMUSCULAR | Status: DC | PRN

## 2024-02-08 MED ORDER — DIPHENHYDRAMINE HCL 50 MG/ML IJ SOLN
INTRAMUSCULAR | Status: DC | PRN
  Administered 2024-02-08: 12.5 mg via INTRAVENOUS

## 2024-02-08 MED ORDER — ACETAMINOPHEN 500 MG PO TABS
1000.0000 mg | ORAL_TABLET | Freq: Four times a day (QID) | ORAL | Status: DC
  Administered 2024-02-08 – 2024-02-11 (×9): 1000 mg via ORAL
  Filled 2024-02-08 (×12): qty 2

## 2024-02-08 MED ORDER — LABETALOL HCL 5 MG/ML IV SOLN: 20.0000 mg | INTRAVENOUS | Status: DC | PRN

## 2024-02-08 MED ORDER — OXYCODONE HCL 5 MG PO TABS
5.0000 mg | ORAL_TABLET | ORAL | Status: DC | PRN
Start: 2024-02-08 — End: 2024-02-11
  Administered 2024-02-10: 5 mg via ORAL
  Filled 2024-02-08: qty 1

## 2024-02-08 MED ORDER — SENNOSIDES-DOCUSATE SODIUM 8.6-50 MG PO TABS
2.0000 | ORAL_TABLET | Freq: Every day | ORAL | Status: DC
  Administered 2024-02-09 – 2024-02-11 (×3): 2 via ORAL
  Filled 2024-02-08 (×3): qty 2

## 2024-02-08 MED ORDER — DEXAMETHASONE SODIUM PHOSPHATE 10 MG/ML IJ SOLN
INTRAMUSCULAR | Status: DC | PRN
Start: 2024-02-08 — End: 2024-02-08
  Administered 2024-02-08: 10 mg via INTRAVENOUS

## 2024-02-08 MED ORDER — FENTANYL CITRATE (PF) 100 MCG/2ML IJ SOLN
INTRAMUSCULAR | Status: AC
  Filled 2024-02-08: qty 2

## 2024-02-08 MED ORDER — ACETAMINOPHEN 10 MG/ML IV SOLN
INTRAVENOUS | Status: DC | PRN
  Administered 2024-02-08: 1000 mg via INTRAVENOUS

## 2024-02-08 MED ORDER — PRENATAL MULTIVITAMIN CH
1.0000 | ORAL_TABLET | Freq: Every day | ORAL | Status: DC
  Administered 2024-02-09 – 2024-02-11 (×3): 1 via ORAL
  Filled 2024-02-08 (×3): qty 1

## 2024-02-08 MED ORDER — LACTATED RINGERS IV SOLN: INTRAVENOUS | Status: DC | PRN

## 2024-02-08 MED ORDER — DIBUCAINE (PERIANAL) 1 % EX OINT: 1.0000 | TOPICAL_OINTMENT | CUTANEOUS | Status: DC | PRN

## 2024-02-08 MED ORDER — INSULIN ASPART 100 UNIT/ML IJ SOLN
0.0000 [IU] | INTRAMUSCULAR | Status: DC
  Administered 2024-02-08: 2 [IU] via SUBCUTANEOUS

## 2024-02-08 MED ORDER — LABETALOL HCL 5 MG/ML IV SOLN: 40.0000 mg | INTRAVENOUS | Status: DC | PRN

## 2024-02-08 MED ORDER — STERILE WATER FOR IRRIGATION IR SOLN
Status: DC | PRN
  Administered 2024-02-08: 1000 mL

## 2024-02-08 MED ORDER — SODIUM CHLORIDE 0.9 % IR SOLN
Status: DC | PRN
  Administered 2024-02-08: 1

## 2024-02-08 MED ORDER — MORPHINE SULFATE (PF) 0.5 MG/ML IJ SOLN
INTRAMUSCULAR | Status: DC | PRN
  Administered 2024-02-08: 3 mg via EPIDURAL

## 2024-02-08 MED ORDER — KETOROLAC TROMETHAMINE 30 MG/ML IJ SOLN
30.0000 mg | Freq: Four times a day (QID) | INTRAMUSCULAR | Status: AC
  Administered 2024-02-08 – 2024-02-09 (×2): 30 mg via INTRAVENOUS
  Filled 2024-02-08 (×4): qty 1

## 2024-02-08 MED ORDER — ONDANSETRON HCL 4 MG/2ML IJ SOLN
INTRAMUSCULAR | Status: DC | PRN
  Administered 2024-02-08: 4 mg via INTRAVENOUS

## 2024-02-08 MED ORDER — NALOXONE HCL 0.4 MG/ML IJ SOLN: 0.4000 mg | INTRAMUSCULAR | Status: DC | PRN

## 2024-02-08 MED ORDER — DIPHENHYDRAMINE HCL 25 MG PO CAPS
25.0000 mg | ORAL_CAPSULE | Freq: Four times a day (QID) | ORAL | Status: DC | PRN
  Administered 2024-02-08 (×2): 25 mg via ORAL
  Filled 2024-02-08: qty 1

## 2024-02-08 MED ORDER — DIPHENHYDRAMINE HCL 50 MG/ML IJ SOLN
INTRAMUSCULAR | Status: AC
  Filled 2024-02-08: qty 1

## 2024-02-08 MED ORDER — SIMETHICONE 80 MG PO CHEW: 80.0000 mg | CHEWABLE_TABLET | ORAL | Status: DC | PRN

## 2024-02-08 MED ORDER — DIPHENHYDRAMINE HCL 50 MG/ML IJ SOLN: 12.5000 mg | INTRAMUSCULAR | Status: DC | PRN

## 2024-02-08 MED ORDER — METFORMIN HCL 500 MG PO TABS
500.0000 mg | ORAL_TABLET | Freq: Two times a day (BID) | ORAL | Status: DC
  Administered 2024-02-08 – 2024-02-11 (×6): 500 mg via ORAL
  Filled 2024-02-08 (×6): qty 1

## 2024-02-08 MED ORDER — HYDRALAZINE HCL 20 MG/ML IJ SOLN: 10.0000 mg | INTRAMUSCULAR | Status: DC | PRN

## 2024-02-08 MED ORDER — SODIUM CHLORIDE 0.9 % IV SOLN
INTRAVENOUS | Status: AC
  Filled 2024-02-08: qty 5

## 2024-02-08 MED ORDER — LIDOCAINE-EPINEPHRINE (PF) 2 %-1:200000 IJ SOLN
INTRAMUSCULAR | Status: DC | PRN
  Administered 2024-02-08 (×3): 5 mL via EPIDURAL

## 2024-02-08 MED ORDER — ONDANSETRON HCL 4 MG/2ML IJ SOLN: 4.0000 mg | Freq: Three times a day (TID) | INTRAMUSCULAR | Status: DC | PRN

## 2024-02-08 MED ORDER — SODIUM CHLORIDE 0.9 % IV SOLN
INTRAVENOUS | Status: DC | PRN
  Administered 2024-02-08: 500 mg via INTRAVENOUS

## 2024-02-08 MED ORDER — TRANEXAMIC ACID-NACL 1000-0.7 MG/100ML-% IV SOLN
INTRAVENOUS | Status: AC
  Filled 2024-02-08: qty 100

## 2024-02-08 MED ORDER — IBUPROFEN 600 MG PO TABS
600.0000 mg | ORAL_TABLET | Freq: Four times a day (QID) | ORAL | Status: DC
  Administered 2024-02-09 – 2024-02-11 (×7): 600 mg via ORAL
  Filled 2024-02-08 (×8): qty 1

## 2024-02-08 MED ORDER — HYDRALAZINE HCL 20 MG/ML IJ SOLN: 5.0000 mg | INTRAMUSCULAR | Status: DC | PRN

## 2024-02-08 MED ORDER — ENOXAPARIN SODIUM 60 MG/0.6ML IJ SOSY
50.0000 mg | PREFILLED_SYRINGE | INTRAMUSCULAR | Status: DC
  Administered 2024-02-09 – 2024-02-10 (×2): 50 mg via SUBCUTANEOUS
  Filled 2024-02-08 (×2): qty 0.6

## 2024-02-08 MED ORDER — ACETAMINOPHEN 10 MG/ML IV SOLN
INTRAVENOUS | Status: AC
  Filled 2024-02-08: qty 100

## 2024-02-08 MED ORDER — MENTHOL 3 MG MT LOZG: 1.0000 | LOZENGE | OROMUCOSAL | Status: DC | PRN

## 2024-02-08 MED ORDER — COCONUT OIL OIL: 1.0000 | TOPICAL_OIL | Status: DC | PRN

## 2024-02-08 MED ORDER — OXYTOCIN-SODIUM CHLORIDE 30-0.9 UT/500ML-% IV SOLN
INTRAVENOUS | Status: DC | PRN
  Administered 2024-02-08: 300 mL via INTRAVENOUS

## 2024-02-08 MED ORDER — MORPHINE SULFATE (PF) 0.5 MG/ML IJ SOLN
INTRAMUSCULAR | Status: AC
  Filled 2024-02-08: qty 10

## 2024-02-08 MED ORDER — FENTANYL CITRATE (PF) 100 MCG/2ML IJ SOLN
INTRAMUSCULAR | Status: DC | PRN
  Administered 2024-02-08: 100 ug via EPIDURAL

## 2024-02-08 MED ORDER — SODIUM CHLORIDE 0.9% FLUSH: 3.0000 mL | INTRAVENOUS | Status: DC | PRN

## 2024-02-08 MED ORDER — DIPHENHYDRAMINE HCL 25 MG PO CAPS
25.0000 mg | ORAL_CAPSULE | ORAL | Status: DC | PRN
  Filled 2024-02-08: qty 1

## 2024-02-08 MED ORDER — LABETALOL HCL 5 MG/ML IV SOLN
INTRAVENOUS | Status: DC | PRN
  Administered 2024-02-08 (×2): 10 mg via INTRAVENOUS

## 2024-02-08 MED ORDER — ZOLPIDEM TARTRATE 5 MG PO TABS: 5.0000 mg | ORAL_TABLET | Freq: Every evening | ORAL | Status: DC | PRN

## 2024-02-08 MED ORDER — OXYTOCIN-SODIUM CHLORIDE 30-0.9 UT/500ML-% IV SOLN
INTRAVENOUS | Status: AC
  Filled 2024-02-08: qty 500

## 2024-02-08 MED ORDER — SIMETHICONE 80 MG PO CHEW
80.0000 mg | CHEWABLE_TABLET | Freq: Three times a day (TID) | ORAL | Status: DC
  Administered 2024-02-08 – 2024-02-11 (×8): 80 mg via ORAL
  Filled 2024-02-08 (×8): qty 1

## 2024-02-08 SURGICAL SUPPLY — 34 items
BENZOIN TINCTURE PRP APPL 2/3 (GAUZE/BANDAGES/DRESSINGS) ×1 IMPLANT
CHLORAPREP W/TINT 26 (MISCELLANEOUS) ×2 IMPLANT
CLAMP UMBILICAL CORD (MISCELLANEOUS) ×1 IMPLANT
CLOTH BEACON ORANGE TIMEOUT ST (SAFETY) ×1 IMPLANT
DRSG OPSITE POSTOP 4X10 (GAUZE/BANDAGES/DRESSINGS) ×1 IMPLANT
ELECT REM PT RETURN 9FT ADLT (ELECTROSURGICAL) ×1 IMPLANT
ELECTRODE REM PT RTRN 9FT ADLT (ELECTROSURGICAL) ×1 IMPLANT
EXTRACTOR VACUUM M CUP 4 TUBE (SUCTIONS) IMPLANT
GLOVE BIO SURGEON STRL SZ7.5 (GLOVE) ×1 IMPLANT
GLOVE BIOGEL PI IND STRL 7.0 (GLOVE) ×1 IMPLANT
GLOVE BIOGEL PI IND STRL 7.5 (GLOVE) ×1 IMPLANT
GOWN STRL REUS W/TWL LRG LVL3 (GOWN DISPOSABLE) ×2 IMPLANT
KIT ABG SYR 3ML LUER SLIP (SYRINGE) IMPLANT
MAT PREVALON FULL STRYKER (MISCELLANEOUS) IMPLANT
NDL HYPO 25X5/8 SAFETYGLIDE (NEEDLE) IMPLANT
NEEDLE HYPO 25X5/8 SAFETYGLIDE (NEEDLE) IMPLANT
NS IRRIG 1000ML POUR BTL (IV SOLUTION) ×1 IMPLANT
PACK C SECTION WH (CUSTOM PROCEDURE TRAY) ×1 IMPLANT
PAD OB MATERNITY 4.3X12.25 (PERSONAL CARE ITEMS) ×1 IMPLANT
RETAINER VISCERAL (MISCELLANEOUS) IMPLANT
RETRACTOR TRAXI PANNICULUS (MISCELLANEOUS) IMPLANT
RTRCTR C-SECT PINK 25CM LRG (MISCELLANEOUS) ×1 IMPLANT
STRIP CLOSURE SKIN 1/2X4 (GAUZE/BANDAGES/DRESSINGS) ×1 IMPLANT
SUT CHROMIC 2 0 CT 1 (SUTURE) ×1 IMPLANT
SUT MNCRL 0 VIOLET CTX 36 (SUTURE) ×1 IMPLANT
SUT MNCRL AB 3-0 PS2 27 (SUTURE) ×1 IMPLANT
SUT MON AB 2-0 CT1 36 (SUTURE) IMPLANT
SUT PLAIN 2 0 XLH (SUTURE) ×1 IMPLANT
SUT VIC AB 0 CT1 36 (SUTURE) ×1 IMPLANT
SUT VIC AB 0 CTX36XBRD ANBCTRL (SUTURE) ×3 IMPLANT
SUT VIC AB 2-0 SH 27XBRD (SUTURE) IMPLANT
TOWEL OR 17X24 6PK STRL BLUE (TOWEL DISPOSABLE) ×1 IMPLANT
TRAY FOLEY W/BAG SLVR 14FR LF (SET/KITS/TRAYS/PACK) ×1 IMPLANT
WATER STERILE IRR 1000ML POUR (IV SOLUTION) ×1 IMPLANT

## 2024-02-08 NOTE — Op Note (Signed)
 Cesarean Section Procedure Note  Indications: Pre-E with severe features with failed IOL and fetal intolerance to labor  Pre-operative Diagnosis: primary cesarean section for non reassuring fetal heart rate, failed induction of labor   Post-operative Diagnosis: primary cesarean section for non reassuring fetal heart rate, failed induction of labor  Procedure: No admission procedures for hospital encounter.  Surgeon: Osborn Coho, MD    Assistants: Wylene Simmer, MD  Anesthesia: Regional  Anesthesiologist: Kaylyn Layer, MD   Procedure Details  The patient was taken to the operating room secondary to failed IOL and fetal intolerance of labor after the risks, benefits, complications, treatment options, and expected outcomes were discussed with the patient.  The patient concurred with the proposed plan, giving informed consent which was signed and witnessed. The patient was taken to Operating Room C, identified as Kristina Murphy and the procedure verified as C-Section Delivery. A Time Out was held and the above information confirmed.  After induction of anesthesia by obtaining a spinal, the patient was prepped and draped in the usual sterile manner. A Pfannenstiel skin incision was made and carried down through the subcutaneous tissue to the underlying layer of fascia.  The fascia was incised bilaterally and extended transversely bilaterally with the Mayo scissors. Kocher clamps were placed on the inferior aspect of the fascial incision and the underlying rectus muscle was separated from the fascia. The same was done on the superior aspect of the fascial incision.  The peritoneum was identified, entered bluntly and extended manually.  An Alexis self-retaining retractor was placed.  The utero-vesical peritoneal reflection was incised transversely and the bladder flap was bluntly freed from the lower uterine segment. A low transverse uterine incision was made with the scalpel and extended  bilaterally with the bandage scissors.  The infant was delivered in vertex position without difficulty.  After the umbilical cord was clamped and cut, the infant was handed to the awaiting pediatricians.  Cord blood was obtained for evaluation.  The placenta was removed intact and appeared to be within normal limits. The uterus was cleared of all clots and debris. The uterine incision was closed with running interlocking sutures of 0 Vicryl and a second imbricating layer was performed as well.   Bilateral tubes and ovaries appeared to be within normal limits.  Good hemostasis was noted.  Copious irrigation was performed until clear.  The peritoneum was repaired with 2-0 chromic via a running suture.  The fascia was reapproximated with a running suture of 0 Vicryl. The subcutaneous tissue was reapproximated with 3 interrupted sutures of 2-0 plain.  The skin was reapproximated with a subcuticular suture of 3-0 monocryl.  Steristrips were applied.  Instrument, sponge, and needle counts were correct prior to abdominal closure and at the conclusion of the case.  The patient was awaiting transfer to the recovery room in good condition.  Findings: Live female infant with Apgars 4 at one minute and 8 at five minutes.  Normal appearing bilateral ovaries and fallopian tubes were noted.  Estimated Blood Loss:  478 ml         Drains: foley to gravity blood tinged 150 cc         Total IV Fluids: 1200 ml         Specimens to Pathology: Placenta         Complications:  None; patient tolerated the procedure well.         Disposition: PACU - hemodynamically stable.  Condition: stable  Attending Attestation: I performed the procedure.  I was present and scrubbed and the assistant was required due to complexity of anatomy.

## 2024-02-08 NOTE — Lactation Note (Signed)
 This note was copied from a baby's chart. Lactation Consultation Note  Patient Name: Kristina Murphy [id number] Date: 02/08/2024 Age:32 hours Reason for consult: Initial assessment  P1- [name] is formula feeding only. Please let LC team know if she is needing LC services.  Feeding Mother's Current Feeding Choice: Formula Nipple Type: Nfant Standard Flow (white)  Consult Status Consult Status: Complete Date: 02/08/24    Dema Severin BS, IBCLC 02/08/2024, 7:04 PM

## 2024-02-08 NOTE — Progress Notes (Signed)
 IUPC and FSE placed Pt has episodes of late decels and minimal variability and remote from delivery I reviewed the options with the patient and she is agreeable to proceed with a c-section for failed induction and fetal intolerance of labor Risks benefits and alternatives reviewed including but not limited to bleeding infection and injury.  Questions answered and consent signed and witnessed. Labs reviewed and creatinine increasing as well

## 2024-02-08 NOTE — Progress Notes (Addendum)
 Tracing reviewed remotely by me cat 1 Ctxs q3-54min VS reviewed Labs reviewed

## 2024-02-08 NOTE — Transfer of Care (Signed)
 Immediate Anesthesia Transfer of Care Note  Patient: Kristina Murphy  Procedure(s) Performed: CESAREAN DELIVERY  Patient Location: PACU  Anesthesia Type:Epidural  Level of Consciousness: awake  Airway & Oxygen Therapy: Patient Spontanous Breathing  Post-op Assessment: Report given to RN and Post -op Vital signs reviewed and stable  Post vital signs: Reviewed and stable  Last Vitals:  Vitals Value Taken Time  BP 115/83 02/08/24 1243  Temp 35.8 C 02/08/24 1243  Pulse 79 02/08/24 1245  Resp 18 02/08/24 1245  SpO2 94 % 02/08/24 1245  Vitals shown include unfiled device data.  Last Pain:  Vitals:   02/08/24 1243  TempSrc: Oral  PainSc:          Complications: No notable events documented.

## 2024-02-08 NOTE — Anesthesia Postprocedure Evaluation (Signed)
 Anesthesia Post Note  Patient: Kristina Murphy  Procedure(s) Performed: CESAREAN DELIVERY     Patient location during evaluation: PACU Anesthesia Type: Epidural Level of consciousness: awake and alert Pain management: pain level controlled Vital Signs Assessment: post-procedure vital signs reviewed and stable Respiratory status: spontaneous breathing, nonlabored ventilation and respiratory function stable Cardiovascular status: blood pressure returned to baseline Postop Assessment: epidural receding, no apparent nausea or vomiting, no headache and no backache Anesthetic complications: no   No notable events documented.  Last Vitals:  Vitals:   02/08/24 1400 02/08/24 1405  BP: (!) 153/88   Pulse: 73 73  Resp:    Temp:    SpO2: 95% 96%    Last Pain:  Vitals:   02/08/24 1408  TempSrc:   PainSc: 0-No pain   Pain Goal:                Epidural/Spinal Function Cutaneous sensation: Tingles (02/08/24 1408), Patient able to flex knees: Yes (02/08/24 1408), Patient able to lift hips off bed: No (02/08/24 1408), Back pain beyond tenderness at insertion site: No (02/08/24 1408), Progressively worsening motor and/or sensory loss: No (02/08/24 1408), Bowel and/or bladder incontinence post epidural: No (02/08/24 1408)  Shanda Howells

## 2024-02-08 NOTE — Progress Notes (Signed)
 The family has decided to keep the baby.  SW saw pt and pt said she gave them information.

## 2024-02-09 LAB — GLUCOSE, CAPILLARY: Glucose-Capillary: 118 mg/dL — ABNORMAL HIGH (ref 70–99)

## 2024-02-09 LAB — CBC
HCT: 31.6 % — ABNORMAL LOW (ref 36.0–46.0)
Hemoglobin: 10 g/dL — ABNORMAL LOW (ref 12.0–15.0)
MCH: 25.9 pg — ABNORMAL LOW (ref 26.0–34.0)
MCHC: 31.6 g/dL (ref 30.0–36.0)
MCV: 81.9 fL (ref 80.0–100.0)
Platelets: 206 10*3/uL (ref 150–400)
RBC: 3.86 MIL/uL — ABNORMAL LOW (ref 3.87–5.11)
RDW: 15.6 % — ABNORMAL HIGH (ref 11.5–15.5)
WBC: 14.6 10*3/uL — ABNORMAL HIGH (ref 4.0–10.5)
nRBC: 0 % (ref 0.0–0.2)

## 2024-02-09 LAB — HGB FRACTIONATION CASCADE
Hgb A2: 1.9 % (ref 1.8–3.2)
Hgb A: 98.1 % (ref 96.4–98.8)
Hgb F: 0 % (ref 0.0–2.0)
Hgb S: 0 %

## 2024-02-09 LAB — COMPREHENSIVE METABOLIC PANEL
ALT: 19 U/L (ref 0–44)
AST: 19 U/L (ref 15–41)
Albumin: 2.1 g/dL — ABNORMAL LOW (ref 3.5–5.0)
Alkaline Phosphatase: 125 U/L (ref 38–126)
Anion gap: 7 (ref 5–15)
BUN: 13 mg/dL (ref 6–20)
CO2: 24 mmol/L (ref 22–32)
Calcium: 7.8 mg/dL — ABNORMAL LOW (ref 8.9–10.3)
Chloride: 105 mmol/L (ref 98–111)
Creatinine, Ser: 1.12 mg/dL — ABNORMAL HIGH (ref 0.44–1.00)
GFR, Estimated: >60 mL/min (ref >60)
Glucose, Bld: 106 mg/dL — ABNORMAL HIGH (ref 70–99)
Potassium: 4.2 mmol/L (ref 3.5–5.1)
Sodium: 136 mmol/L (ref 135–145)
Total Bilirubin: <0.2 mg/dL (ref 0.0–1.2)
Total Protein: 5.7 g/dL — ABNORMAL LOW (ref 6.5–8.1)

## 2024-02-09 LAB — MAGNESIUM: Magnesium: 4.6 mg/dL — ABNORMAL HIGH (ref 1.7–2.4)

## 2024-02-09 MED ORDER — NIFEDIPINE ER OSMOTIC RELEASE 30 MG PO TB24
30.0000 mg | ORAL_TABLET | Freq: Every day | ORAL | Status: DC
Start: 2024-02-09 — End: 2024-02-10
  Administered 2024-02-09 – 2024-02-10 (×2): 30 mg via ORAL
  Filled 2024-02-09 (×2): qty 1

## 2024-02-09 NOTE — Clinical Social Work Maternal (Addendum)
 CLINICAL SOCIAL WORK MATERNAL/CHILD NOTE  Patient Details  Name: Kristina Murphy MRN: [id number] Date of Birth: November 14, 1991  Date:  02/09/2024  Clinical Social Worker Initiating Note:  Kristina Heron, LCSW Date/Time: Initiated:  02/09/24/1302     Child's Name:  Kristina Murphy   Biological Parents:  Mother, Father (Kristina Murphy: Kristina Murphy [birth year] 1992, FOB: Kristina Murphy (36))   Need for Interpreter:  None   Reason for Referral:  Late or No Prenatal Care     Address:  [address] [place] [address]    Phone number:  [phone] (home)     Additional phone number:   Household Members/Support Persons (HM/SP):   Household Member/Support Person 1   HM/SP Name Relationship DOB or Age  HM/SP -1 Kristina Murphy Significant Other 36  HM/SP -2        HM/SP -3        HM/SP -4        HM/SP -5        HM/SP -6        HM/SP -7        HM/SP -8          Natural Supports (not living in the home):  Immediate Family   Professional Supports: None   Employment: Full-time   Type of Work: [employer]   Education:  [employer] school graduate   Homebound arranged:    Surveyor, quantity Resources:  Media planner    Other Resources:      Cultural/Religious Considerations Which May Impact Care:    Strengths:  Ability to meet basic needs  , Home prepared for child     Psychotropic Medications:         Pediatrician:       Pediatrician List:   [facility] [place]    [place]    [place] [facility]      Pediatrician Fax Number:    Risk Factors/Current Problems:  None   Cognitive State:  Able to Concentrate  , Goal Oriented  , Alert     Mood/Affect:  Calm  , Comfortable     CSW Assessment: CSW received consult for no prenatal care. CSW met with Kristina Murphy to offer support and complete assessment.    CSW met with Kristina Murphy at bedside and introduced CSW role. CSW observed Kristina Murphy sitting up in bed, the infant asleep  in the bassinet and visitors present at bedside. Kristina Murphy introduced her visitors as her older brother and sister. CSW congratulated family on baby boy, Kristina Murphy. CSW offered Kristina Murphy privacy. Kristina Murphy family left the room to allow Kristina Murphy privacy. Kristina Murphy presented pleasant, calm and engaged with CSW throughout assessment. CSW asked Kristina Murphy if the demographic information on hospital was correct. Kristina Murphy reported that the information on hospital file was correct. CSW inquired if FOB Kristina Murphy) would be involved. Kristina Murphy reported that FOB was involved and was at their home cleaning a room to prepare for the infant. Kristina Murphy stated, "we will keep the baby". CSW acknowledged Kristina Murphy statement and decision to parent the infant. Kristina Murphy expressed "feeling good" now that the baby was here. CSW inquired if Kristina Murphy would be able to get essential items for the infant. Kristina Murphy reported that she would be able to get a car seat, bassinet, clothes, diapers and wipes for the infant. However, Kristina Murphy reported that she would need an outfit for the infant to change into after she bathed him today.  CSW agreed to provide Kristina Murphy with a baby bundle. CSW inquired if Kristina Murphy received SNAP benefits. Kristina Murphy reported that her income would disqualify for the benefits. CSW inquired about Kristina Murphy supports. Kristina Murphy identified her mom, FOB, brother and sister as supports. Kristina Murphy mentioned that she would have the next six weeks for maternity leave.   CSW inquired about Kristina Murphy late prenatal care. Kristina Murphy reported that she found out she was pregnant at "37 weeks". Kristina Murphy reported that she felt "everything happened so fast." Kristina Murphy reported that she was told years ago that she could not have children, so she was "shocked" when learned about the baby. Kristina Murphy mentioned that she took metformin, Ozempic and birth control while she was pregnant. CSW informed Kristina Murphy about the hospital drug screen policy and that CSW will monitor the infant cord drug screen and make a report to CPS, if warranted. Kristina Murphy denied any substance use during the pregnancy.    CSW inquired if Kristina Murphy had mental health history. Kristina Murphy denied mental health history. CSW provided education regarding the baby blues period vs. perinatal mood disorders, discussed treatment and gave resources for mental health follow up if concerns arise.  CSW recommended Kristina Murphy complete a self-evaluation during the postpartum time period using the New Mom Checklist from Postpartum Progress and encouraged Kristina Murphy to contact a medical professional if symptoms are noted at any time. Kristina Murphy reported that she felt comfortable reaching out to a provider. CSW assessed Kristina Murphy for safety. Kristina Murphy denied SI/HI and DV.   CSW provided review of Sudden Infant Death Syndrome (SIDS) precautions. 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 CSW asked Kristina Murphy if she had chosen a pediatrician. Kristina Murphy reported that she and FOB had not chosen a pregnancy. CSW offered Kristina Murphy list of pediatricians for assistance. Kristina Murphy accepted.   CSW informed Kristina Murphy about Education officer, environmental. Kristina Murphy expressed interest and would follow up with [facility] staff when they visited her at bedside.   CSW identifies no further need for intervention and no barriers to discharge at this time.    CSW Plan/Description:  Perinatal Mood and Anxiety Disorder (PMADs) Education, Hospital Drug Screen Policy Information, CSW Will Continue to Monitor Umbilical Cord Tissue Drug Screen Results and Make Report if Warranted, No Further Intervention Required/No Barriers to Discharge    Kristina Coots, LCSW 02/09/2024, 1:13 PM

## 2024-02-09 NOTE — Progress Notes (Signed)
 Subjective: Postpartum Day 1: Cesarean Delivery Patient reports no complaints. She is tolerating a regular diet, ambulating and voiding without difficulty.  She denies headache or chest pain or shortness of breath.   Objective: Vital signs in last 24 hours: Temp:  [96.4 F (35.8 C)-98.9 F (37.2 C)] 98.4 F (36.9 C) (03/13 1151) Pulse Rate:  [66-86] 79 (03/13 1151) Resp:  [12-19] 19 (03/13 1151) BP: (105-153)/(55-103) 135/78 (03/13 1151) SpO2:  [93 %-97 %] 96 % (03/13 1151)    02/09/2024   11:51 AM 02/09/2024    8:09 AM 02/09/2024    4:50 AM  Vitals with BMI  Systolic 135 105 161  Diastolic 78 55 84  Pulse 79 75 78    Physical Exam:  General: alert, cooperative, and no distress CVS: s1, S2, Regular rate and rhythm Pulmonary: Clear to auscultation bilaterally.  Lochia: appropriate Uterine Fundus: firm Incision: with dressing, with small dried blood on dressing, marked.  DVT Evaluation: No evidence of DVT seen on physical exam. Calf/Ankle edema is present.  Recent Labs    02/08/24 1757 02/09/24 0424  HGB 11.4* 10.0*  HCT 35.4* 31.6*      Latest Ref Rng & Units 02/09/2024    4:24 AM 02/08/2024    5:57 PM 02/08/2024    1:53 PM  CBC  WBC 4.0 - 10.5 K/uL 14.6  15.7  19.4   Hemoglobin 12.0 - 15.0 g/dL 09.6  04.5  40.9   Hematocrit 36.0 - 46.0 % 31.6  35.4  35.9   Platelets 150 - 400 K/uL 206  214  192        Latest Ref Rng & Units 02/09/2024    4:24 AM 02/08/2024    5:57 PM 02/08/2024    8:23 AM  CMP  Glucose 70 - 99 mg/dL 811  914  85   BUN 6 - 20 mg/dL 13  8  7    Creatinine 0.44 - 1.00 mg/dL 7.82  9.56  2.13   Sodium 135 - 145 mmol/L 136  135  135   Potassium 3.5 - 5.1 mmol/L 4.2  4.2  4.0   Chloride 98 - 111 mmol/L 105  103  103   CO2 22 - 32 mmol/L 24  21  24    Calcium 8.9 - 10.3 mg/dL 7.8  7.6  7.7   Total Protein 6.5 - 8.1 g/dL 5.7  5.9  6.5   Total Bilirubin 0.0 - 1.2 mg/dL <0.8  0.3  0.4   Alkaline Phos 38 - 126 U/L 125  140  151   AST 15 - 41 U/L 19  24   27    ALT 0 - 44 U/L 19  22  26      CBG (last 3)  Recent Labs    02/08/24 0811 02/08/24 1303 02/09/24 0604  GLUCAP 87 139* 118*     Assessment/Plan: 32 y/o G1P1001 POD # 1 s/p primary cesarean section, stable,  - With pre eclampsia with severe features and is s/p 24 hours of postpartum magnesium sulfate. Will closely monitor blood pressures. For repeat preeclampsia labs tomorrow.  - Lovenox for DVT prophylaxis.  - Follow fasting FSG checks for Type 2 DM and on metformin.  - s/p social work consultation.   Prescilla Sours, MD 02/09/2024, 12:34 PM

## 2024-02-10 LAB — COMPREHENSIVE METABOLIC PANEL
ALT: 16 U/L (ref 0–44)
AST: 18 U/L (ref 15–41)
Albumin: 2.2 g/dL — ABNORMAL LOW (ref 3.5–5.0)
Alkaline Phosphatase: 114 U/L (ref 38–126)
Anion gap: 11 (ref 5–15)
BUN: 15 mg/dL (ref 6–20)
CO2: 21 mmol/L — ABNORMAL LOW (ref 22–32)
Calcium: 8.7 mg/dL — ABNORMAL LOW (ref 8.9–10.3)
Chloride: 106 mmol/L (ref 98–111)
Creatinine, Ser: 0.74 mg/dL (ref 0.44–1.00)
GFR, Estimated: >60 mL/min (ref >60)
Glucose, Bld: 90 mg/dL (ref 70–99)
Potassium: 4.3 mmol/L (ref 3.5–5.1)
Sodium: 138 mmol/L (ref 135–145)
Total Bilirubin: 0.2 mg/dL (ref 0.0–1.2)
Total Protein: 5.9 g/dL — ABNORMAL LOW (ref 6.5–8.1)

## 2024-02-10 LAB — CBC
HCT: 30.4 % — ABNORMAL LOW (ref 36.0–46.0)
Hemoglobin: 9.5 g/dL — ABNORMAL LOW (ref 12.0–15.0)
MCH: 25.5 pg — ABNORMAL LOW (ref 26.0–34.0)
MCHC: 31.3 g/dL (ref 30.0–36.0)
MCV: 81.7 fL (ref 80.0–100.0)
Platelets: 207 10*3/uL (ref 150–400)
RBC: 3.72 MIL/uL — ABNORMAL LOW (ref 3.87–5.11)
RDW: 15.9 % — ABNORMAL HIGH (ref 11.5–15.5)
WBC: 13.2 10*3/uL — ABNORMAL HIGH (ref 4.0–10.5)
nRBC: 0 % (ref 0.0–0.2)

## 2024-02-10 LAB — SURGICAL PATHOLOGY

## 2024-02-10 MED ORDER — NIFEDIPINE ER OSMOTIC RELEASE 30 MG PO TB24: 30.0000 mg | ORAL_TABLET | Freq: Once | ORAL | Status: DC

## 2024-02-10 MED ORDER — NIFEDIPINE ER OSMOTIC RELEASE 60 MG PO TB24
60.0000 mg | ORAL_TABLET | Freq: Every day | ORAL | Status: DC
  Administered 2024-02-11: 60 mg via ORAL
  Filled 2024-02-10: qty 1

## 2024-02-10 MED ORDER — NIFEDIPINE ER OSMOTIC RELEASE 30 MG PO TB24
30.0000 mg | ORAL_TABLET | Freq: Once | ORAL | Status: AC
  Administered 2024-02-10: 30 mg via ORAL
  Filled 2024-02-10: qty 1

## 2024-02-10 NOTE — Progress Notes (Signed)
 Subjective: POD# 2 Information for the patient's newborn:  Dhruvi, Crenshaw [[id number]]  female  Baby's Name [facility] Fall Circumcision Yes  Reports feeling very tired "no sleep last night" Feeding: formula Reports tolerating PO and denies N/V, foley removed, ambulating and urinating w/o difficulty  Pain controlled with  PO meds Denies HA/SOB/dizziness  Flatus passing Vaginal bleeding is normal, no clots     Objective:  VS:  Vitals:   02/09/24 2042 02/10/24 0140 02/10/24 0550 02/10/24 0835  BP: (!) 144/82 (!) 149/87 (!) 143/82 (!) 149/99  Pulse: 86 86 79 97  Resp: 16 18 18 18   Temp: 98.6 F (37 C) 98.1 F (36.7 C) 98 F (36.7 C) 98.6 F (37 C)  TempSrc: Oral Oral Oral Oral  SpO2: 98% 99% 99%   Weight:      Height:        Intake/Output Summary (Last 24 hours) at 02/10/2024 1058 Last data filed at 02/10/2024 1017 Gross per 24 hour  Intake 1120 ml  Output 1300 ml  Net -180 ml     Recent Labs    02/09/24 0424 02/10/24 0442  WBC 14.6* 13.2*  HGB 10.0* 9.5*  HCT 31.6* 30.4*  PLT 206 207    Blood type: --/--/B POS (03/10 2133) Rubella: 2.34 (03/10 2142)    Physical Exam:  General: alert, cooperative, and no distress CV: Regular rate and rhythm Resp: clear Abdomen: soft, nontender, normal bowel sounds Incision: serous drainage present, dressing change ordered Perineum:  Uterine Fundus: firm, below umbilicus, nontender Lochia: minimal Ext: 2+ edema, negative for tenderness, pain, and cords   Assessment/Plan: 33 y.o.   POD# 2. [id number]                  Principal Problem:   Encounter for induction of labor Preeclampsia with severe features   -S/P magnesium x 24 hrs postpartum   -persistent moderate range BP with PO procardia XL 30 mg - dose increased to 60 mg Primary LTCS for non-reassuring fetal surveillance  Routine post-op PP care          Advance diet as tolerated Advised warm fluids and ambulation to improve GI motility Lactation support PRN Anticipate  D/C POD 3  Roma Schanz, DNP, CNM 02/10/2024, 10:58 AM

## 2024-02-11 DIAGNOSIS — O1413 Severe pre-eclampsia, third trimester: Secondary | ICD-10-CM | POA: Diagnosis present

## 2024-02-11 LAB — GLUCOSE, CAPILLARY: Glucose-Capillary: 77 mg/dL (ref 70–99)

## 2024-02-11 MED ORDER — OXYCODONE HCL 5 MG PO TABS: 5.0000 mg | ORAL_TABLET | Freq: Four times a day (QID) | ORAL | 0 refills | Status: AC | PRN

## 2024-02-11 MED ORDER — IBUPROFEN 600 MG PO TABS: 600.0000 mg | ORAL_TABLET | Freq: Four times a day (QID) | ORAL | 0 refills | Status: DC

## 2024-02-11 MED ORDER — ACETAMINOPHEN 500 MG PO TABS: 1000.0000 mg | ORAL_TABLET | Freq: Four times a day (QID) | ORAL | Status: DC

## 2024-02-11 MED ORDER — TETANUS-DIPHTH-ACELL PERTUSSIS 5-2.5-18.5 LF-MCG/0.5 IM SUSY
0.5000 mL | PREFILLED_SYRINGE | Freq: Once | INTRAMUSCULAR | Status: AC
  Administered 2024-02-11: 0.5 mL via INTRAMUSCULAR

## 2024-02-11 MED ORDER — NIFEDIPINE ER 60 MG PO TB24: 60.0000 mg | ORAL_TABLET | Freq: Every day | ORAL | 0 refills | Status: DC

## 2024-02-11 NOTE — Plan of Care (Signed)
 Problem: Education: Goal: Knowledge of Childbirth will improve Outcome: Adequate for Discharge Goal: Ability to make informed decisions regarding treatment and plan of care will improve Outcome: Adequate for Discharge Goal: Ability to state and carry out methods to decrease the pain will improve Outcome: Adequate for Discharge Goal: Individualized Educational Video(s) Outcome: Adequate for Discharge   Problem: Coping: Goal: Ability to verbalize concerns and feelings about labor and delivery will improve Outcome: Adequate for Discharge   Problem: Life Cycle: Goal: Ability to make normal progression through stages of labor will improve Outcome: Adequate for Discharge Goal: Ability to effectively push during vaginal delivery will improve Outcome: Adequate for Discharge   Problem: Role Relationship: Goal: Will demonstrate positive interactions with the child Outcome: Adequate for Discharge   Problem: Safety: Goal: Risk of complications during labor and delivery will decrease Outcome: Adequate for Discharge   Problem: Pain Management: Goal: Relief or control of pain from uterine contractions will improve Outcome: Adequate for Discharge   Problem: Education: Goal: Knowledge of General Education information will improve Description: Including pain rating scale, medication(s)/side effects and non-pharmacologic comfort measures Outcome: Adequate for Discharge   Problem: Health Behavior/Discharge Planning: Goal: Ability to manage health-related needs will improve Outcome: Adequate for Discharge   Problem: Clinical Measurements: Goal: Ability to maintain clinical measurements within normal limits will improve Outcome: Adequate for Discharge Goal: Will remain free from infection Outcome: Adequate for Discharge Goal: Diagnostic test results will improve Outcome: Adequate for Discharge Goal: Respiratory complications will improve Outcome: Adequate for Discharge Goal:  Cardiovascular complication will be avoided Outcome: Adequate for Discharge   Problem: Activity: Goal: Risk for activity intolerance will decrease Outcome: Adequate for Discharge   Problem: Nutrition: Goal: Adequate nutrition will be maintained Outcome: Adequate for Discharge   Problem: Coping: Goal: Level of anxiety will decrease Outcome: Adequate for Discharge   Problem: Elimination: Goal: Will not experience complications related to bowel motility Outcome: Adequate for Discharge Goal: Will not experience complications related to urinary retention Outcome: Adequate for Discharge   Problem: Pain Managment: Goal: General experience of comfort will improve and/or be controlled Outcome: Adequate for Discharge   Problem: Safety: Goal: Ability to remain free from injury will improve Outcome: Adequate for Discharge   Problem: Skin Integrity: Goal: Risk for impaired skin integrity will decrease Outcome: Adequate for Discharge   Problem: Education: Goal: Ability to describe self-care measures that may prevent or decrease complications (Diabetes Survival Skills Education) will improve Outcome: Adequate for Discharge Goal: Individualized Educational Video(s) Outcome: Adequate for Discharge   Problem: Coping: Goal: Ability to adjust to condition or change in health will improve Outcome: Adequate for Discharge   Problem: Fluid Volume: Goal: Ability to maintain a balanced intake and output will improve Outcome: Adequate for Discharge   Problem: Health Behavior/Discharge Planning: Goal: Ability to identify and utilize available resources and services will improve Outcome: Adequate for Discharge Goal: Ability to manage health-related needs will improve Outcome: Adequate for Discharge   Problem: Metabolic: Goal: Ability to maintain appropriate glucose levels will improve Outcome: Adequate for Discharge   Problem: Nutritional: Goal: Maintenance of adequate nutrition will  improve Outcome: Adequate for Discharge Goal: Progress toward achieving an optimal weight will improve Outcome: Adequate for Discharge   Problem: Skin Integrity: Goal: Risk for impaired skin integrity will decrease Outcome: Adequate for Discharge   Problem: Tissue Perfusion: Goal: Adequacy of tissue perfusion will improve Outcome: Adequate for Discharge   Problem: Education: Goal: Knowledge of the prescribed therapeutic regimen will improve Outcome: Adequate  for Discharge Goal: Understanding of sexual limitations or changes related to disease process or condition will improve Outcome: Adequate for Discharge Goal: Individualized Educational Video(s) Outcome: Adequate for Discharge   Problem: Self-Concept: Goal: Communication of feelings regarding changes in body function or appearance will improve Outcome: Adequate for Discharge   Problem: Skin Integrity: Goal: Demonstration of wound healing without infection will improve Outcome: Adequate for Discharge   Problem: Education: Goal: Knowledge of disease or condition will improve Outcome: Adequate for Discharge Goal: Knowledge of the prescribed therapeutic regimen will improve Outcome: Adequate for Discharge   Problem: Fluid Volume: Goal: Peripheral tissue perfusion will improve Outcome: Adequate for Discharge   Problem: Clinical Measurements: Goal: Complications related to disease process, condition or treatment will be avoided or minimized Outcome: Adequate for Discharge

## 2024-02-11 NOTE — Discharge Summary (Signed)
 Postpartum Discharge Summary  Date of Service updated 02/11/24    Patient Name: Kristina Murphy DOB: [birth year] MRN: [id number]  Date of admission: 02/06/2024 Delivery date:02/08/2024 Delivering provider: Osborn Coho Date of discharge: 02/11/2024  Admitting diagnosis: Preeclampsia complicating hypertension [O11.9] Encounter for induction of labor [Z34.90] Intrauterine pregnancy: [redacted]w[redacted]d     Secondary diagnosis:  Principal Problem:   Encounter for induction of labor Active Problems:   Preeclampsia, severe, third trimester  Additional problems: none    Discharge diagnosis: Preeclampsia (severe) and late preterm delivery at 36+6 wks                                               Post partum procedures: none Augmentation: Pitocin and Cytotec Complications: None  Hospital course: Induction of Labor With Cesarean Section   33 y.o. yo G1P0101 at 107w6d was admitted to the hospital 02/06/2024 for induction of labor. Patient had a labor course significant for induction of labor. The patient went for cesarean section due to Arrest of Dilation and Non-Reassuring FHR. Delivery details are as follows: Membrane Rupture Time/Date: 7:45 PM,02/07/2024  Delivery Method:C-Section, Low Transverse Operative Delivery:N/A Details of operation can be found in separate operative Note.  Patient had a postpartum course complicated by pre-eclampsia wither severe features designated by severe range blood pressure and a protein creatinine ratio of 0.17 She is ambulating, tolerating a regular diet, passing flatus, and urinating well.  Patient is discharged home in stable condition on 02/11/24.      Newborn Data: Birth date:02/08/2024 Birth time:11:52 AM Gender:Female Living status:Living Apgars:4 ,8  Weight:2300 g                               Magnesium Sulfate received: Yes: Seizure prophylaxis BMZ received: No Rhophylac:N/A MMR:N/A T-DaP:Given postpartum Flu: No RSV Vaccine received:  No Transfusion:No Immunizations administered: Immunization History  Administered Date(s) Administered   Pfizer(Comirnaty)Fall Seasonal Vaccine 12 years and older 09/21/2023    Physical exam  Vitals:   02/10/24 1942 02/10/24 2326 02/11/24 0439 02/11/24 0752  BP: (!) 159/96 (!) 147/91 139/83 (!) 148/91  Pulse: (!) 103 98 92 95  Resp: 19 17 (P) 18 18  Temp: 98 F (36.7 C) 97.8 F (36.6 C) (P) 98 F (36.7 C) 98.1 F (36.7 C)  TempSrc: Oral Oral (P) Oral Oral  SpO2: 98% 97% (P) 95% 98%  Weight:      Height:       General: alert, cooperative, and no distress Lochia: appropriate Uterine Fundus: firm Incision: Healing well with no significant drainage, Dressing is clean, dry, and intact DVT Evaluation: No evidence of DVT seen on physical exam. No cords or calf tenderness. No significant calf/ankle edema. Labs: Lab Results  Component Value Date   WBC 13.2 (H) 02/10/2024   HGB 9.5 (L) 02/10/2024   HCT 30.4 (L) 02/10/2024   MCV 81.7 02/10/2024   PLT 207 02/10/2024      Latest Ref Rng & Units 02/10/2024    4:42 AM  CMP  Glucose 70 - 99 mg/dL 90   BUN 6 - 20 mg/dL 15   Creatinine 4.09 - 1.00 mg/dL 8.11   Sodium 914 - 782 mmol/L 138   Potassium 3.5 - 5.1 mmol/L 4.3   Chloride 98 - 111 mmol/L 106   CO2  22 - 32 mmol/L 21   Calcium 8.9 - 10.3 mg/dL 8.7   Total Protein 6.5 - 8.1 g/dL 5.9   Total Bilirubin 0.0 - 1.2 mg/dL 0.2   Alkaline Phos 38 - 126 U/L 114   AST 15 - 41 U/L 18   ALT 0 - 44 U/L 16    Edinburgh Score:    02/09/2024    8:22 AM  Edinburgh Postnatal Depression Scale Screening Tool  I have been able to laugh and see the funny side of things. 0  I have looked forward with enjoyment to things. 0  I have blamed myself unnecessarily when things went wrong. 1  I have been anxious or worried for no good reason. 2  I have felt scared or panicky for no good reason. 0  Things have been getting on top of me. 0  I have been so unhappy that I have had difficulty  sleeping. 1  I have felt sad or miserable. 1  I have been so unhappy that I have been crying. 1  The thought of harming myself has occurred to me. 0  Edinburgh Postnatal Depression Scale Total 6      After visit meds:  Allergies as of 02/11/2024       Reactions   Coconut Flavoring Agent (non-screening) Swelling        Medication List     STOP taking these medications    cyclobenzaprine 10 MG tablet Commonly known as: FLEXERIL       TAKE these medications    acetaminophen 500 MG tablet Commonly known as: TYLENOL Take 2 tablets (1,000 mg total) by mouth every 6 (six) hours.   ibuprofen 600 MG tablet Commonly known as: ADVIL Take 1 tablet (600 mg total) by mouth every 6 (six) hours. What changed:  when to take this reasons to take this   metFORMIN 500 MG tablet Commonly known as: GLUCOPHAGE Take 500 mg by mouth 2 (two) times daily with a meal.   NIFEdipine 60 MG 24 hr tablet Commonly known as: ADALAT CC Take 1 tablet (60 mg total) by mouth daily.   oxyCODONE 5 MG immediate release tablet Commonly known as: Oxy IR/ROXICODONE Take 1 tablet (5 mg total) by mouth every 6 (six) hours as needed for up to 5 days for moderate pain (pain score 4-6), severe pain (pain score 7-10) or breakthrough pain.   prenatal multivitamin Tabs tablet Take 1 tablet by mouth daily at 12 noon.               Discharge Care Instructions  (From admission, onward)           Start     Ordered   02/11/24 0000  Discharge wound care:       Comments: Take dressing off on 02/13/24, remove it sooner if it is dirty or damaged. Clean area with soap and water and pat dry. You can leave the steri strips on until they fall off or take them off gently by 02/14/31. Call the office for increased drainage, redness, pain, or warmth. Keep the incision area clean and dry at all times.   02/11/24 1042             Discharge home in stable condition Infant Feeding:  formula Infant  Disposition:home with mother Discharge instruction: per After Visit Summary and Postpartum booklet. Activity: Advance as tolerated. Pelvic rest for 6 weeks.  Diet: low salt diet Anticipated Birth Control: OCPs Postpartum Appointment:6 weeks Additional Postpartum F/U:  BP check 2-3 days Future Appointments:No future appointments. Follow up Visit:  Follow-up Information     Osborn Coho, MD. Go on 02/13/2024.   Specialty: Obstetrics and Gynecology Why: Return to [facility] on 02/13/24 to have your blood pressure checked and then in 6 weeks for a postpartum visit. Contact information: [address] [place] [address] [phone]                     02/11/2024 Roma Schanz, CNM

## 2024-02-11 NOTE — Progress Notes (Signed)
 Discharge AVS pp instructions reviewed with patient, when to call MD, medications, follow-up appointment, when to remove honeycomb dressing, and Pre-eclampsia s/s.Patient verbalizes understanding. Verified that patient has access to her [employer] app for additional postpartum instructions.

## 2024-02-14 ENCOUNTER — Inpatient Hospital Stay (HOSPITAL_COMMUNITY)
Admission: EM | Admit: 2024-02-14 | Discharge: 2024-02-14 | Disposition: A | Discharge status: Home / Self Care | Attending: Obstetrics and Gynecology | Admitting: Obstetrics and Gynecology

## 2024-02-14 ENCOUNTER — Encounter (HOSPITAL_COMMUNITY): Payer: Self-pay

## 2024-02-14 ENCOUNTER — Other Ambulatory Visit: Payer: Self-pay

## 2024-02-14 DIAGNOSIS — Z79899 Other long term (current) drug therapy: Secondary | ICD-10-CM | POA: Diagnosis not present

## 2024-02-14 DIAGNOSIS — O165 Unspecified maternal hypertension, complicating the puerperium: Secondary | ICD-10-CM | POA: Diagnosis not present

## 2024-02-14 DIAGNOSIS — O900 Disruption of cesarean delivery wound: Secondary | ICD-10-CM | POA: Insufficient documentation

## 2024-02-14 DIAGNOSIS — O909 Complication of the puerperium, unspecified: Secondary | ICD-10-CM | POA: Diagnosis not present

## 2024-02-14 LAB — COMPREHENSIVE METABOLIC PANEL
ALT: 18 U/L (ref 0–44)
AST: 17 U/L (ref 15–41)
Albumin: 2.6 g/dL — ABNORMAL LOW (ref 3.5–5.0)
Alkaline Phosphatase: 96 U/L (ref 38–126)
Anion gap: 11 (ref 5–15)
BUN: 7 mg/dL (ref 6–20)
CO2: 21 mmol/L — ABNORMAL LOW (ref 22–32)
Calcium: 8.6 mg/dL — ABNORMAL LOW (ref 8.9–10.3)
Chloride: 109 mmol/L (ref 98–111)
Creatinine, Ser: 0.62 mg/dL (ref 0.44–1.00)
GFR, Estimated: >60 mL/min (ref >60)
Glucose, Bld: 97 mg/dL (ref 70–99)
Potassium: 3.5 mmol/L (ref 3.5–5.1)
Sodium: 141 mmol/L (ref 135–145)
Total Bilirubin: 0.5 mg/dL (ref 0.0–1.2)
Total Protein: 6.7 g/dL (ref 6.5–8.1)

## 2024-02-14 LAB — CBC
HCT: 31.1 % — ABNORMAL LOW (ref 36.0–46.0)
Hemoglobin: 9.8 g/dL — ABNORMAL LOW (ref 12.0–15.0)
MCH: 26.2 pg (ref 26.0–34.0)
MCHC: 31.5 g/dL (ref 30.0–36.0)
MCV: 83.2 fL (ref 80.0–100.0)
Platelets: 321 10*3/uL (ref 150–400)
RBC: 3.74 MIL/uL — ABNORMAL LOW (ref 3.87–5.11)
RDW: 15.2 % (ref 11.5–15.5)
WBC: 10.5 10*3/uL (ref 4.0–10.5)
nRBC: 0 % (ref 0.0–0.2)

## 2024-02-14 MED ORDER — LABETALOL HCL 5 MG/ML IV SOLN: 40.0000 mg | INTRAVENOUS | Status: DC | PRN

## 2024-02-14 MED ORDER — NIFEDIPINE ER OSMOTIC RELEASE 30 MG PO TB24
60.0000 mg | ORAL_TABLET | Freq: Once | ORAL | Status: AC
  Administered 2024-02-14: 60 mg via ORAL
  Filled 2024-02-14: qty 2

## 2024-02-14 MED ORDER — NIFEDIPINE 10 MG PO CAPS
20.0000 mg | ORAL_CAPSULE | ORAL | Status: DC | PRN
  Administered 2024-02-14: 20 mg via ORAL
  Filled 2024-02-14: qty 2

## 2024-02-14 MED ORDER — NIFEDIPINE ER 60 MG PO TB24: 60.0000 mg | ORAL_TABLET | Freq: Every morning | ORAL | 0 refills | Status: AC

## 2024-02-14 MED ORDER — NIFEDIPINE 10 MG PO CAPS
10.0000 mg | ORAL_CAPSULE | ORAL | Status: DC | PRN
  Administered 2024-02-14: 10 mg via ORAL
  Filled 2024-02-14: qty 1

## 2024-02-14 MED ORDER — NIFEDIPINE 10 MG PO CAPS: 20.0000 mg | ORAL_CAPSULE | ORAL | Status: DC | PRN

## 2024-02-14 NOTE — MAU Provider Note (Signed)
 Chief Complaint:  Incision Check   HPI    Kristina Murphy is a 33 y.o. G1P0101  Postpartum Day 6 s/p primary  C- Section for NRFHRT. Delivery was on 3/12 and she was discharged on 02/11/23. She was admitted for PreE with Severe features on 02/06/24   She presents to maternity admissions reporting a concern for incisional separation or wound dehiscence. Patient reports she removed her dressing today and noted blood seeping from the incision. Denies any foul odor, colored discharge , no fever, or chills.   Patient's history is also significant for PreE with SF and was discharged home on Procardia 60XL daily. Patient reports she did not take her medication today since she takes it before bed. She denies any HA's, visual changes, RUQ pain, and denies any N/V/D. Her BP's at home per patient have been "normal" On arrival here her initial BP was 181/91 cycling  Pregnancy Course: [facility]  Past Medical History:  Diagnosis Date   Diabetes mellitus without complication (HCC)    OB History  Gravida Para Term Preterm AB Living  1 1  1  1   SAB IAB Ectopic Multiple Live Births     0 1    # Outcome Date GA Lbr Len/2nd Weight Sex Type Anes PTL Lv  1 Preterm 02/08/24 [redacted]w[redacted]d  2300 g M CS-LTranv EPI  LIV   Past Surgical History:  Procedure Laterality Date   BRAIN SURGERY     CESAREAN SECTION N/A 02/08/2024   Procedure: CESAREAN DELIVERY;  Surgeon: Osborn Coho, MD;  Location: [facility] LD ORS;  Service: Obstetrics;  Laterality: N/A;   EYE SURGERY     FOOT SURGERY     LEG SURGERY     Family History  Problem Relation Age of Onset   Healthy Mother    Birth defects Maternal Grandmother    Social History   Tobacco Use   Smoking status: Never   Smokeless tobacco: Never  Vaping Use   Vaping status: Never Used  Substance Use Topics   Alcohol use: No   Drug use: No   Allergies  Allergen Reactions   Coconut Flavoring Agent (Non-Screening) Swelling   Medications Prior to Admission  Medication Sig  Dispense Refill Last Dose/Taking   ibuprofen (ADVIL) 600 MG tablet Take 1 tablet (600 mg total) by mouth every 6 (six) hours. 30 tablet 0 02/14/2024 at  9:00 AM   metFORMIN (GLUCOPHAGE) 500 MG tablet Take 500 mg by mouth 2 (two) times daily with a meal.   02/14/2024 at  8:30 AM   oxyCODONE (OXY IR/ROXICODONE) 5 MG immediate release tablet Take 1 tablet (5 mg total) by mouth every 6 (six) hours as needed for up to 5 days for moderate pain (pain score 4-6), severe pain (pain score 7-10) or breakthrough pain. 20 tablet 0 02/13/2024 at  8:30 PM   Prenatal Vit-Fe Fumarate-FA (PRENATAL MULTIVITAMIN) TABS tablet Take 1 tablet by mouth daily at 12 noon.   02/14/2024 at  8:30 AM   [DISCONTINUED] NIFEdipine (ADALAT CC) 60 MG 24 hr tablet Take 1 tablet (60 mg total) by mouth daily. 30 tablet 0 02/13/2024 at 10:00 PM   acetaminophen (TYLENOL) 500 MG tablet Take 2 tablets (1,000 mg total) by mouth every 6 (six) hours.       I have reviewed patient's Past Medical Hx, Surgical Hx, Family Hx, Social Hx, medications and allergies.   ROS  Pertinent items noted in HPI and remainder of comprehensive ROS otherwise negative.   PHYSICAL EXAM  Patient Vitals for the past 24 hrs:  BP Temp Temp src Pulse Resp SpO2 Height Weight  02/14/24 1745 (!) 143/82 -- -- (!) 122 -- 99 % -- --  02/14/24 1730 136/85 -- -- (!) 113 -- -- -- --  02/14/24 1715 (!) 154/90 -- -- (!) 119 -- -- -- --  02/14/24 1700 (!) 155/92 -- -- 95 18 99 % -- --  02/14/24 1645 (!) 160/95 -- -- 89 -- 99 % -- --  02/14/24 1630 (!) 168/98 -- -- 88 -- 98 % -- --  02/14/24 1615 (!) 160/99 -- -- 98 -- 99 % -- --  02/14/24 1545 (!) 152/100 -- -- 99 -- 99 % -- --  02/14/24 1531 (!) 156/96 -- -- 95 -- -- -- --  02/14/24 1516 (!) 156/103 98.5 F (36.9 C) Oral 97 18 99 % -- --  02/14/24 1511 -- -- -- -- -- -- 4\' 11"  (1.499 m) 99.3 kg  02/14/24 1454 -- -- -- -- -- -- 4\' 11"  (1.499 m) 103 kg  02/14/24 1451 (!) 181/91 98.1 F (36.7 C) -- 94 18 100 % -- --     Constitutional: Well-developed, obese  female in no acute distress.  Cardiovascular: normal rate & rhythm, warm and well-perfused Respiratory: normal effort, no problems with respiration noted GI: Soft , appropriately tender ( see picture)  Physical Exam Vitals and nursing note reviewed. Exam conducted with a chaperone present.  Abdominal:       Comments: Incision has 2 areas of superficial separation, fascia intact  no induration, no erythema, no drainage from the incision. The  incision was cleaned with normal saline and steri strips were reapplied to the incision . A sterile abdominal pad was applied and secured     MS: Extremities nontender, no edema, normal ROM Neurologic: Alert and oriented x 4.  GU: no CVA tenderness         Labs: Results for orders placed or performed during the hospital encounter of 02/14/24 (from the past 24 hours)  CBC     Status: Abnormal   Collection Time: 02/14/24  3:43 PM  Result Value Ref Range   WBC 10.5 4.0 - 10.5 K/uL   RBC 3.74 (L) 3.87 - 5.11 MIL/uL   Hemoglobin 9.8 (L) 12.0 - 15.0 g/dL   HCT 40.9 (L) 81.1 - 91.4 %   MCV 83.2 80.0 - 100.0 fL   MCH 26.2 26.0 - 34.0 pg   MCHC 31.5 30.0 - 36.0 g/dL   RDW 78.2 95.6 - 21.3 %   Platelets 321 150 - 400 K/uL   nRBC 0.0 0.0 - 0.2 %  Comprehensive metabolic panel     Status: Abnormal   Collection Time: 02/14/24  3:43 PM  Result Value Ref Range   Sodium 141 135 - 145 mmol/L   Potassium 3.5 3.5 - 5.1 mmol/L   Chloride 109 98 - 111 mmol/L   CO2 21 (L) 22 - 32 mmol/L   Glucose, Bld 97 70 - 99 mg/dL   BUN 7 6 - 20 mg/dL   Creatinine, Ser 0.86 0.44 - 1.00 mg/dL   Calcium 8.6 (L) 8.9 - 10.3 mg/dL   Total Protein 6.7 6.5 - 8.1 g/dL   Albumin 2.6 (L) 3.5 - 5.0 g/dL   AST 17 15 - 41 U/L   ALT 18 0 - 44 U/L   Alkaline Phosphatase 96 38 - 126 U/L   Total Bilirubin 0.5 0.0 - 1.2 mg/dL   GFR, Estimated >57 >  60 mL/min   Anion gap 11 5 - 15    Imaging:  No results found.  MDM & MAU COURSE   MDM:  HIGH  - BP monitoring/control - Surgical incision dressing change - Labs - unremarkable - D/W OB Attending Dr Jolayne Panther ( Will change time of her Procardia to AM dosing and 48 hour f/u with Primary OB at [facility])    I have reviewed the patient chart and performed the physical exam . I have ordered & interpreted the lab results and performed the dressing change on the incision with RN assistance   Medications ordered as stated below.  A/P as described below.  Counseling and education provided and patient agreeable  with plan as described below. Verbalized understanding.    MAU Course: Orders Placed This Encounter  Procedures   CBC   Comprehensive metabolic panel   Notify physician (specify) Confirmatory reading of BP> 160/110 15 minutes later   Apply Hypertensive Disorders of Pregnancy Care Plan   Vital signs   Measure blood pressure   Discharge patient Discharge disposition: 01-Home or Self Care; Discharge patient date: 02/14/2024   Meds ordered this encounter  Medications   NIFEdipine (PROCARDIA-XL/NIFEDICAL-XL) 24 hr tablet 60 mg   AND Linked Order Group    NIFEdipine (PROCARDIA) capsule 10 mg    NIFEdipine (PROCARDIA) capsule 20 mg    NIFEdipine (PROCARDIA) capsule 20 mg    labetalol (NORMODYNE) injection 40 mg   NIFEdipine (ADALAT CC) 60 MG 24 hr tablet    Sig: Take 1 tablet (60 mg total) by mouth in the morning.    Dispense:  30 tablet    Refill:  0    Supervising Provider:   Reva Bores [[id number]]    ASSESSMENT   1. Hypertension in pregnancy, postpartum condition   2. Cesarean section wound complication   3. Postpartum state     PLAN   Discharge home in stable condition with strict return precautions F/U with OB Th/Fri for BP check and incision check  See AVS for full description of verbal and written instructions provided to the patient at discharge. She verbalized understanding and agrees with plan as described above.   Allergies as of 02/14/2024        Reactions   Coconut Flavoring Agent (non-screening) Swelling        Medication List     TAKE these medications    acetaminophen 500 MG tablet Commonly known as: TYLENOL Take 2 tablets (1,000 mg total) by mouth every 6 (six) hours.   ibuprofen 600 MG tablet Commonly known as: ADVIL Take 1 tablet (600 mg total) by mouth every 6 (six) hours.   metFORMIN 500 MG tablet Commonly known as: GLUCOPHAGE Take 500 mg by mouth 2 (two) times daily with a meal.   NIFEdipine 60 MG 24 hr tablet Commonly known as: ADALAT CC Take 1 tablet (60 mg total) by mouth in the morning. What changed: when to take this   oxyCODONE 5 MG immediate release tablet Commonly known as: Oxy IR/ROXICODONE Take 1 tablet (5 mg total) by mouth every 6 (six) hours as needed for up to 5 days for moderate pain (pain score 4-6), severe pain (pain score 7-10) or breakthrough pain.   prenatal multivitamin Tabs tablet Take 1 tablet by mouth daily at 12 noon.        Marcell Barlow, MSN, [facility], [facility] [employer]

## 2024-02-14 NOTE — MAU Note (Signed)
 Kristina Murphy is a 33 y.o. at [redacted]w[redacted]d here in MAU reporting: she had a Cesarean last Wednesday and approximately 1 inch of the incision is opening and draining bloody drainage. S/P Cesarean 02/08/2024 LMP: NA Onset of complaint: today Pain score: 0 Vitals:   02/14/24 1451  BP: (!) 181/91  Pulse: 94  Resp: 18  Temp: 98.1 F (36.7 C)  SpO2: 100%     FHT: NA  Lab orders placed from triage: None

## 2024-02-14 NOTE — Progress Notes (Addendum)
 Anastasia Fiedler, NP into see pt and eval incision, no tunneling noted. Incision cleaned with NS, steri strips applied, and abd pad applied to cover incision. Pt also given instructions regarding incision care once discharged with understanding verbalized.

## 2024-02-14 NOTE — Discharge Instructions (Signed)
 Follow up with your DR this week Th/Friday  Continue daily Blood pressure monitoring

## 2024-02-14 NOTE — ED Triage Notes (Signed)
 Pt states she just had a c-section last Wednesday. Pt states the bandages were coming off of incision area and she was told she could take it off and wash with soap and water and she states she has been it has been having blood come out. Pt states she wants to make sure everything is okay. Pt has mild amount of blood in top part of underwear. Pt has approx 1 in opened area in the incision

## 2024-02-20 ENCOUNTER — Telehealth (HOSPITAL_COMMUNITY): Payer: Self-pay | Admitting: *Deleted

## 2024-02-20 NOTE — Telephone Encounter (Signed)
 02/20/2024  Name: Kristina Murphy MRN: [id number] DOB: August 12, 1991  Reason for Call:  Transition of Care Hospital Discharge Call  Contact Status: Patient Contact Status: Complete  Language assistant needed:          Follow-Up Questions: Do You Have Any Concerns About Your Health As You Heal From Delivery?: No Do You Have Any Concerns About Your Infants Health?: No Patient reported that she has a f/u appointment scheduled with OB tomorrow to reassess c-section incision. Edinburgh Postnatal Depression Scale:  In the Past 7 Days: I have been able to laugh and see the funny side of things.: As much as I always could I have looked forward with enjoyment to things.: As much as I ever did I have blamed myself unnecessarily when things went wrong.: No, never I have been anxious or worried for no good reason.: No, not at all I have felt scared or panicky for no good reason.: No, not at all Things have been getting on top of me.: No, I have been coping as well as ever I have been so unhappy that I have had difficulty sleeping.: Not at all I have felt sad or miserable.: No, not at all I have been so unhappy that I have been crying.: No, never The thought of harming myself has occurred to me.: Never Inocente Salles Postnatal Depression Scale Total: 0  PHQ2-9 Depression Scale:     Discharge Follow-up: Edinburgh score requires follow up?: No Patient was advised of the following resources:: Breastfeeding Support Group, Support Group  Post-discharge interventions: Reviewed Newborn Safe Sleep Practices  Signature Deforest Hoyles, RN, 02/20/24, [phone]

## 2024-02-21 DIAGNOSIS — Z4801 Encounter for change or removal of surgical wound dressing: Secondary | ICD-10-CM | POA: Diagnosis not present

## 2024-03-07 DIAGNOSIS — O900 Disruption of cesarean delivery wound: Secondary | ICD-10-CM | POA: Diagnosis not present

## 2024-03-07 DIAGNOSIS — T8140XA Infection following a procedure, unspecified, initial encounter: Secondary | ICD-10-CM | POA: Diagnosis not present

## 2024-03-21 DIAGNOSIS — Z1331 Encounter for screening for depression: Secondary | ICD-10-CM | POA: Diagnosis not present

## 2024-03-21 DIAGNOSIS — Z308 Encounter for other contraceptive management: Secondary | ICD-10-CM | POA: Diagnosis not present

## 2024-03-21 DIAGNOSIS — Z3040 Encounter for surveillance of contraceptives, unspecified: Secondary | ICD-10-CM | POA: Diagnosis not present

## 2024-05-11 DIAGNOSIS — F53 Postpartum depression: Secondary | ICD-10-CM | POA: Diagnosis not present

## 2024-05-11 DIAGNOSIS — E1169 Type 2 diabetes mellitus with other specified complication: Secondary | ICD-10-CM | POA: Diagnosis not present

## 2024-05-11 DIAGNOSIS — K219 Gastro-esophageal reflux disease without esophagitis: Secondary | ICD-10-CM | POA: Diagnosis not present

## 2024-05-11 DIAGNOSIS — I10 Essential (primary) hypertension: Secondary | ICD-10-CM | POA: Diagnosis not present

## 2024-06-13 ENCOUNTER — Other Ambulatory Visit: Payer: Self-pay | Admitting: Obstetrics and Gynecology

## 2024-08-03 DIAGNOSIS — Z1383 Encounter for screening for respiratory disorder NEC: Secondary | ICD-10-CM | POA: Diagnosis not present

## 2024-08-03 DIAGNOSIS — Z2911 Encounter for prophylactic immunotherapy for respiratory syncytial virus (RSV): Secondary | ICD-10-CM | POA: Diagnosis not present

## 2024-08-07 ENCOUNTER — Encounter (HOSPITAL_COMMUNITY): Payer: Self-pay | Admitting: Obstetrics and Gynecology

## 2024-08-07 NOTE — Progress Notes (Signed)
 Spoke w/ via phone for pre-op interview---[facility] needs dos---- CBC,T&S and UPT per surgeon. CBG, BMP A1C and EKG per anesthesia.        Lab results------ COVID test -----patient states asymptomatic no test needed Arrive at -------1200 NPO after MN NO Solid Food.  Clear liquids from MN until---1100 Pre-Surgery Ensure or G2:  Med rec completed Medications to take morning of surgery -----BCP Diabetic medication -----NONE AM of surgery  GLP1 agonist last dose: GLP1 instructions:  Patient instructed no nail polish to be worn day of surgery Patient instructed to bring photo id and insurance card day of surgery Patient aware to have Driver (ride ) / caregiver    for 24 hours after surgery - Boyfriend Al Luck Patient Special Instructions ----- Pre-Op special Instructions -----  Patient verbalized understanding of instructions that were given at this phone interview. Patient denies chest pain, sob, fever, cough at the interview.

## 2024-08-10 ENCOUNTER — Other Ambulatory Visit: Payer: Self-pay

## 2024-08-10 ENCOUNTER — Ambulatory Visit (HOSPITAL_COMMUNITY)

## 2024-08-10 ENCOUNTER — Encounter (HOSPITAL_COMMUNITY): Payer: Self-pay | Admitting: Obstetrics and Gynecology

## 2024-08-10 ENCOUNTER — Encounter (HOSPITAL_COMMUNITY)
Admission: RE | Disposition: A | Discharge status: Home / Self Care | Payer: Self-pay | Source: Home / Self Care | Attending: Obstetrics and Gynecology

## 2024-08-10 ENCOUNTER — Ambulatory Visit (HOSPITAL_COMMUNITY)
Admission: RE | Admit: 2024-08-10 | Discharge: 2024-08-10 | Disposition: A | Discharge status: Home / Self Care | Attending: Obstetrics and Gynecology | Admitting: Obstetrics and Gynecology

## 2024-08-10 DIAGNOSIS — I10 Essential (primary) hypertension: Secondary | ICD-10-CM | POA: Insufficient documentation

## 2024-08-10 DIAGNOSIS — Z6841 Body Mass Index (BMI) 40.0 and over, adult: Secondary | ICD-10-CM | POA: Diagnosis not present

## 2024-08-10 DIAGNOSIS — Z7984 Long term (current) use of oral hypoglycemic drugs: Secondary | ICD-10-CM | POA: Diagnosis not present

## 2024-08-10 DIAGNOSIS — Z302 Encounter for sterilization: Secondary | ICD-10-CM | POA: Insufficient documentation

## 2024-08-10 DIAGNOSIS — E66813 Obesity, class 3: Secondary | ICD-10-CM | POA: Diagnosis not present

## 2024-08-10 DIAGNOSIS — E119 Type 2 diabetes mellitus without complications: Secondary | ICD-10-CM | POA: Diagnosis not present

## 2024-08-10 HISTORY — PX: LAPAROSCOPIC BILATERAL SALPINGECTOMY: SHX5889

## 2024-08-10 HISTORY — DX: Essential (primary) hypertension: I10

## 2024-08-10 LAB — BASIC METABOLIC PANEL WITH GFR
Anion gap: 12 (ref 5–15)
BUN: 11 mg/dL (ref 6–20)
CO2: 21 mmol/L — ABNORMAL LOW (ref 22–32)
Calcium: 8.8 mg/dL — ABNORMAL LOW (ref 8.9–10.3)
Chloride: 103 mmol/L (ref 98–111)
Creatinine, Ser: 0.62 mg/dL (ref 0.44–1.00)
GFR, Estimated: >60 mL/min (ref >60)
Glucose, Bld: 99 mg/dL (ref 70–99)
Potassium: 4.2 mmol/L (ref 3.5–5.1)
Sodium: 136 mmol/L (ref 135–145)

## 2024-08-10 LAB — GLUCOSE, CAPILLARY
Glucose-Capillary: 95 mg/dL (ref 70–99)
Glucose-Capillary: 132 mg/dL — ABNORMAL HIGH (ref 70–99)

## 2024-08-10 LAB — CBC
HCT: 41.2 % (ref 36.0–46.0)
Hemoglobin: 11.8 g/dL — ABNORMAL LOW (ref 12.0–15.0)
MCH: 21.1 pg — ABNORMAL LOW (ref 26.0–34.0)
MCHC: 28.6 g/dL — ABNORMAL LOW (ref 30.0–36.0)
MCV: 73.8 fL — ABNORMAL LOW (ref 80.0–100.0)
Platelets: 387 K/uL (ref 150–400)
RBC: 5.58 MIL/uL — ABNORMAL HIGH (ref 3.87–5.11)
RDW: 16.9 % — ABNORMAL HIGH (ref 11.5–15.5)
WBC: 12.5 K/uL — ABNORMAL HIGH (ref 4.0–10.5)
nRBC: 0 % (ref 0.0–0.2)

## 2024-08-10 LAB — TYPE AND SCREEN
ABO/RH(D): B POS
Antibody Screen: NEGATIVE

## 2024-08-10 LAB — HEMOGLOBIN A1C
Hgb A1c MFr Bld: 6.5 % — ABNORMAL HIGH (ref 4.8–5.6)
Mean Plasma Glucose: 139.85 mg/dL

## 2024-08-10 LAB — POCT PREGNANCY, URINE: Preg Test, Ur: NEGATIVE

## 2024-08-10 SURGERY — SALPINGECTOMY, BILATERAL, LAPAROSCOPIC
Anesthesia: General | Laterality: Bilateral

## 2024-08-10 MED ORDER — ROCURONIUM BROMIDE 10 MG/ML (PF) SYRINGE
PREFILLED_SYRINGE | INTRAVENOUS | Status: AC
Start: 2024-08-10 — End: 2024-08-10
  Filled 2024-08-10: qty 10

## 2024-08-10 MED ORDER — ACETAMINOPHEN 500 MG PO TABS
1000.0000 mg | ORAL_TABLET | Freq: Once | ORAL | Status: AC
  Administered 2024-08-10: 1000 mg via ORAL

## 2024-08-10 MED ORDER — ROCURONIUM BROMIDE 10 MG/ML (PF) SYRINGE
PREFILLED_SYRINGE | INTRAVENOUS | Status: DC | PRN
  Administered 2024-08-10: 70 mg via INTRAVENOUS

## 2024-08-10 MED ORDER — LIDOCAINE 2% (20 MG/ML) 5 ML SYRINGE
INTRAMUSCULAR | Status: DC | PRN
  Administered 2024-08-10: 100 mg via INTRAVENOUS

## 2024-08-10 MED ORDER — CEFAZOLIN SODIUM 1 G IJ SOLR
INTRAMUSCULAR | Status: AC
  Filled 2024-08-10: qty 10

## 2024-08-10 MED ORDER — OXYCODONE HCL 5 MG/5ML PO SOLN: 5.0000 mg | Freq: Once | ORAL | Status: DC | PRN

## 2024-08-10 MED ORDER — SUGAMMADEX SODIUM 200 MG/2ML IV SOLN
INTRAVENOUS | Status: DC | PRN
  Administered 2024-08-10: 200 mg via INTRAVENOUS

## 2024-08-10 MED ORDER — AMISULPRIDE (ANTIEMETIC) 5 MG/2ML IV SOLN: 10.0000 mg | Freq: Once | INTRAVENOUS | Status: DC | PRN

## 2024-08-10 MED ORDER — ORAL CARE MOUTH RINSE: 15.0000 mL | Freq: Once | OROMUCOSAL | Status: AC

## 2024-08-10 MED ORDER — PROPOFOL 10 MG/ML IV BOLUS
INTRAVENOUS | Status: DC | PRN
  Administered 2024-08-10: 150 mg via INTRAVENOUS

## 2024-08-10 MED ORDER — FENTANYL CITRATE (PF) 100 MCG/2ML IJ SOLN: 25.0000 ug | INTRAMUSCULAR | Status: DC | PRN

## 2024-08-10 MED ORDER — PROPOFOL 10 MG/ML IV BOLUS
INTRAVENOUS | Status: AC
  Filled 2024-08-10: qty 20

## 2024-08-10 MED ORDER — BUPIVACAINE HCL 0.25 % IJ SOLN
INTRAMUSCULAR | Status: DC | PRN
  Administered 2024-08-10: 10 mL

## 2024-08-10 MED ORDER — DEXAMETHASONE SODIUM PHOSPHATE 10 MG/ML IJ SOLN
INTRAMUSCULAR | Status: DC | PRN
  Administered 2024-08-10: 10 mg via INTRAVENOUS

## 2024-08-10 MED ORDER — FENTANYL CITRATE (PF) 250 MCG/5ML IJ SOLN
INTRAMUSCULAR | Status: DC | PRN
  Administered 2024-08-10 (×3): 50 ug via INTRAVENOUS
  Administered 2024-08-10: 100 ug via INTRAVENOUS

## 2024-08-10 MED ORDER — PHENYLEPHRINE 80 MCG/ML (10ML) SYRINGE FOR IV PUSH (FOR BLOOD PRESSURE SUPPORT)
PREFILLED_SYRINGE | INTRAVENOUS | Status: DC | PRN
  Administered 2024-08-10: 80 ug via INTRAVENOUS

## 2024-08-10 MED ORDER — ONDANSETRON HCL 4 MG/2ML IJ SOLN
INTRAMUSCULAR | Status: DC | PRN
  Administered 2024-08-10: 4 mg via INTRAVENOUS

## 2024-08-10 MED ORDER — CHLORHEXIDINE GLUCONATE 0.12 % MT SOLN
OROMUCOSAL | Status: AC
  Filled 2024-08-10: qty 15

## 2024-08-10 MED ORDER — OXYCODONE HCL 5 MG PO TABS: 5.0000 mg | ORAL_TABLET | Freq: Once | ORAL | Status: DC | PRN

## 2024-08-10 MED ORDER — BUPIVACAINE HCL (PF) 0.25 % IJ SOLN
INTRAMUSCULAR | Status: AC
  Filled 2024-08-10: qty 30

## 2024-08-10 MED ORDER — MIDAZOLAM HCL 2 MG/2ML IJ SOLN
INTRAMUSCULAR | Status: DC | PRN
  Administered 2024-08-10: 2 mg via INTRAVENOUS

## 2024-08-10 MED ORDER — KETOROLAC TROMETHAMINE 30 MG/ML IJ SOLN
INTRAMUSCULAR | Status: DC | PRN
  Administered 2024-08-10: 30 mg via INTRAVENOUS

## 2024-08-10 MED ORDER — OXYCODONE HCL 5 MG PO TABS: 5.0000 mg | ORAL_TABLET | Freq: Four times a day (QID) | ORAL | 0 refills | Status: AC | PRN

## 2024-08-10 MED ORDER — ACETAMINOPHEN 500 MG PO TABS
ORAL_TABLET | ORAL | Status: AC
  Filled 2024-08-10: qty 2

## 2024-08-10 MED ORDER — FENTANYL CITRATE (PF) 250 MCG/5ML IJ SOLN
INTRAMUSCULAR | Status: AC
  Filled 2024-08-10: qty 5

## 2024-08-10 MED ORDER — MIDAZOLAM HCL 2 MG/2ML IJ SOLN
INTRAMUSCULAR | Status: AC
  Filled 2024-08-10: qty 2

## 2024-08-10 MED ORDER — ONDANSETRON HCL 4 MG/2ML IJ SOLN
INTRAMUSCULAR | Status: AC
Start: 2024-08-10 — End: 2024-08-10
  Filled 2024-08-10: qty 2

## 2024-08-10 MED ORDER — 0.9 % SODIUM CHLORIDE (POUR BTL) OPTIME
TOPICAL | Status: DC | PRN
  Administered 2024-08-10: 1000 mL

## 2024-08-10 MED ORDER — FENTANYL CITRATE (PF) 100 MCG/2ML IJ SOLN
INTRAMUSCULAR | Status: AC
  Filled 2024-08-10: qty 2

## 2024-08-10 MED ORDER — POVIDONE-IODINE 10 % EX SWAB: 2.0000 | Freq: Once | CUTANEOUS | Status: DC

## 2024-08-10 MED ORDER — FENTANYL CITRATE (PF) 100 MCG/2ML IJ SOLN
25.0000 ug | INTRAMUSCULAR | Status: DC | PRN
  Administered 2024-08-10: 25 ug via INTRAVENOUS

## 2024-08-10 MED ORDER — CHLORHEXIDINE GLUCONATE 0.12 % MT SOLN
15.0000 mL | Freq: Once | OROMUCOSAL | Status: AC
  Administered 2024-08-10: 15 mL via OROMUCOSAL

## 2024-08-10 MED ORDER — CEFAZOLIN SODIUM-DEXTROSE 2-3 GM-%(50ML) IV SOLR
INTRAVENOUS | Status: DC | PRN
  Administered 2024-08-10: 2 g via INTRAVENOUS

## 2024-08-10 MED ORDER — IBUPROFEN 600 MG PO TABS: 600.0000 mg | ORAL_TABLET | Freq: Four times a day (QID) | ORAL | 0 refills | Status: AC | PRN

## 2024-08-10 MED ORDER — LACTATED RINGERS IV SOLN: INTRAVENOUS | Status: DC

## 2024-08-10 MED ORDER — LIDOCAINE 2% (20 MG/ML) 5 ML SYRINGE
INTRAMUSCULAR | Status: AC
  Filled 2024-08-10: qty 5

## 2024-08-10 SURGICAL SUPPLY — 33 items
APPLICATOR COTTON TIP 6 STRL (MISCELLANEOUS) IMPLANT
BARRIER ADHS 3X4 INTERCEED (GAUZE/BANDAGES/DRESSINGS) IMPLANT
DERMABOND ADVANCED .7 DNX12 (GAUZE/BANDAGES/DRESSINGS) ×1 IMPLANT
DRAPE SURG IRRIG POUCH 19X23 (DRAPES) ×1 IMPLANT
DRSG OPSITE POSTOP 3X4 (GAUZE/BANDAGES/DRESSINGS) IMPLANT
DURAPREP 26ML APPLICATOR (WOUND CARE) ×1 IMPLANT
FORCEPS CUTTING 33CM 5MM (CUTTING FORCEPS) IMPLANT
FORCEPS CUTTING 45CM 5MM (CUTTING FORCEPS) IMPLANT
GAUZE PETROLATUM 1 X8 (GAUZE/BANDAGES/DRESSINGS) IMPLANT
GLOVE BIO SURGEON STRL SZ 6.5 (GLOVE) ×1 IMPLANT
GLOVE BIOGEL PI IND STRL 7.0 (GLOVE) ×3 IMPLANT
GLOVE SURG SS PI 6.5 STRL IVOR (GLOVE) IMPLANT
GOWN STRL REUS W/ TWL LRG LVL3 (GOWN DISPOSABLE) ×2 IMPLANT
IRRIGATION SUCT STRKRFLW 2 WTP (MISCELLANEOUS) IMPLANT
KIT PINK PAD W/HEAD ARM REST (MISCELLANEOUS) ×1 IMPLANT
KIT TURNOVER KIT B (KITS) ×1 IMPLANT
MANIPULATOR UTERINE 4.5 ZUMI (MISCELLANEOUS) IMPLANT
NS IRRIG 1000ML POUR BTL (IV SOLUTION) ×1 IMPLANT
PACK LAPAROSCOPY BASIN (CUSTOM PROCEDURE TRAY) ×1 IMPLANT
SET TUBE SMOKE EVAC HIGH FLOW (TUBING) ×1 IMPLANT
SHEARS HARMONIC 36 ACE (MISCELLANEOUS) IMPLANT
SLEEVE Z-THREAD 5X100MM (TROCAR) ×1 IMPLANT
SOLUTION ELECTROSURG ANTI STCK (MISCELLANEOUS) IMPLANT
SUT MNCRL AB 3-0 PS2 27 (SUTURE) ×1 IMPLANT
SUT VICRYL 0 ENDOLOOP (SUTURE) IMPLANT
SUT VICRYL 0 UR6 27IN ABS (SUTURE) ×2 IMPLANT
SYSTEM BAG RETRIEVAL 10MM (BASKET) IMPLANT
TOWEL GREEN STERILE FF (TOWEL DISPOSABLE) ×2 IMPLANT
TRAY FOLEY W/BAG SLVR 14FR (SET/KITS/TRAYS/PACK) ×1 IMPLANT
TROCAR BALLN 12MMX100 BLUNT (TROCAR) ×1 IMPLANT
TROCAR XCEL NON-BLD 5MMX100MML (ENDOMECHANICALS) ×1 IMPLANT
TROCAR Z-THREAD FIOS 5X100MM (TROCAR) IMPLANT
WARMER LAPAROSCOPE (MISCELLANEOUS) ×1 IMPLANT

## 2024-08-10 NOTE — Transfer of Care (Signed)
 Immediate Anesthesia Transfer of Care Note  Patient: Kristina Murphy  Procedure(s) Performed: SALPINGECTOMY, BILATERAL, LAPAROSCOPIC (Bilateral)  Patient Location: PACU  Anesthesia Type:General  Level of Consciousness: awake and sedated  Airway & Oxygen Therapy: Patient Spontanous Breathing and Patient connected to face mask oxygen  Post-op Assessment: Report given to RN and Post -op Vital signs reviewed and stable  Post vital signs: Reviewed and stable  Last Vitals:  Vitals Value Taken Time  BP 119/89 08/10/24 18:45  Temp    Pulse 98 08/10/24 18:45  Resp 17 08/10/24 18:45  SpO2 98 % 08/10/24 18:45  Vitals shown include unfiled device data.  Last Pain:  Vitals:   08/10/24 1346  TempSrc: Oral  PainSc: 0-No pain      Patients Stated Pain Goal: 2 (08/10/24 1346)  Complications: No notable events documented.

## 2024-08-10 NOTE — Anesthesia Preprocedure Evaluation (Addendum)
 Anesthesia Evaluation  Patient identified by MRN, date of birth, ID band Patient awake    Reviewed: Allergy & Precautions, NPO status , Patient's Chart, lab work & pertinent test results  History of Anesthesia Complications Negative for: history of anesthetic complications  Airway Mallampati: III  TM Distance: >3 FB     Dental no notable dental hx.    Pulmonary neg pulmonary ROS, neg COPD   breath sounds clear to auscultation       Cardiovascular hypertension, (-) angina (-) CAD, (-) Past MI and (-) Cardiac Stents  Rhythm:Regular Rate:Normal     Neuro/Psych neg Seizures negative neurological ROS  negative psych ROS   GI/Hepatic negative GI ROS, Neg liver ROS,neg GERD  ,,(+) neg Cirrhosis        Endo/Other  diabetes, Type 2, Oral Hypoglycemic Agents  Class 3 obesity (BMI 44)  Renal/GU Renal diseasenegative Renal ROS  negative genitourinary   Musculoskeletal negative musculoskeletal ROS (+)    Abdominal   Peds  Hematology negative hematology ROS (+)   Anesthesia Other Findings   Reproductive/Obstetrics                              Anesthesia Physical Anesthesia Plan  ASA: 3  Anesthesia Plan: General   Post-op Pain Management: Tylenol  PO (pre-op)*   Induction: Intravenous  PONV Risk Score and Plan: 3 and Midazolam , Dexamethasone  and Ondansetron   Airway Management Planned: Oral ETT  Additional Equipment:   Intra-op Plan:   Post-operative Plan: Extubation in OR  Informed Consent: I have reviewed the patients History and Physical, chart, labs and discussed the procedure including the risks, benefits and alternatives for the proposed anesthesia with the patient or authorized representative who has indicated his/her understanding and acceptance.     Dental advisory given  Plan Discussed with: CRNA  Anesthesia Plan Comments:          Anesthesia Quick Evaluation

## 2024-08-10 NOTE — H&P (Signed)
 Kristina Murphy is an 33 y.o. female. Presents for bllateral salpingectomy .  She recently had a baby from an unexpected pregnancy.  She would like to make sure she does not get pregnant because it would put a huge stress on her and her family    Pertinent Gynecological History: Menses: flow is light Bleeding: dysfunctional uterine bleeding and  Contraception: OCP (estrogen/progesterone) DES exposure: denies Blood transfusions: none Sexually transmitted diseases: no past history Previous GYN Procedures: CS  Last mammogram: NA Date:  Last pap: normal Date: 2024 OB History: G1, P1   Menstrual History: Menarche age: 23 Patient's last menstrual period was 07/29/2024.    Past Medical History:  Diagnosis Date   Diabetes mellitus without complication (HCC)    Hypertension    During pregnancy    Past Surgical History:  Procedure Laterality Date   BRAIN SURGERY     CESAREAN SECTION N/A 02/08/2024   Procedure: CESAREAN DELIVERY;  Surgeon: Henry Slough, MD;  Location: [facility] LD ORS;  Service: Obstetrics;  Laterality: N/A;   EYE SURGERY     FOOT SURGERY     LEG SURGERY      Family History  Problem Relation Age of Onset   Healthy Mother    Birth defects Maternal Grandmother     Social History:  reports that she has never smoked. She has never used smokeless tobacco. She reports that she does not drink alcohol and does not use drugs.  Allergies:  Allergies  Allergen Reactions   Coconut Flavoring Agent (Non-Screening) Swelling    No medications prior to admission.    Review of Systems  Last menstrual period 07/29/2024, not currently breastfeeding. Physical Exam Physical Examination: General appearance - alert, well appearing, and in no distress Chest - clear to auscultation, no wheezes, rales or rhonchi, symmetric air entry Heart - normal rate and regular rhythm Abdomen - soft, nontender, nondistended, no masses or organomegaly OBESE Pelvic - normal external genitalia,  vulva, vagina, cervix, uterus and adnexa Extremities - Homan's sign negative bilaterally   No results found for this or any previous visit (from the past 24 hours).  No results found.  Assessment/Plan: DESIRES STERILITY PLAN L/S B SALPINGECTOMY  R&B REVIEWED IN DETAIL  Aqsa Sensabaugh A Floy Riegler 08/10/2024, 12:05 PM

## 2024-08-10 NOTE — Anesthesia Postprocedure Evaluation (Signed)
 Anesthesia Post Note  Patient: Kristina Murphy  Procedure(s) Performed: SALPINGECTOMY, BILATERAL, LAPAROSCOPIC (Bilateral)     Patient location during evaluation: PACU Anesthesia Type: General Level of consciousness: awake and alert Pain management: pain level controlled Vital Signs Assessment: post-procedure vital signs reviewed and stable Respiratory status: spontaneous breathing, nonlabored ventilation, respiratory function stable and patient connected to nasal cannula oxygen Cardiovascular status: blood pressure returned to baseline and stable Postop Assessment: no apparent nausea or vomiting Anesthetic complications: no   No notable events documented.  Last Vitals:  Vitals:   08/10/24 1842 08/10/24 1845  BP: 123/76 119/89  Pulse: (!) 107 97  Resp: 19 19  Temp: (!) 36.2 C   SpO2: 97% 99%    Last Pain:  Vitals:   08/10/24 1842  TempSrc:   PainSc: Asleep                 Thom JONELLE Peoples

## 2024-08-10 NOTE — Op Note (Signed)
  Post-operative Diagnosis: same  Surgeon: [id number],WJPFJ A   Assistants: Dr Henry  Anesthesia: General endotracheal anesthesia  EBL minimal   IVF 900 cc  Procedure Details  The patient was seen in the Holding Room. The risks, benefits, complications, treatment options, and expected outcomes were discussed with the patient. The possibilities of reaction to medication, pulmonary aspiration, perforation of viscus, bleeding, recurrent infection, the need for additional procedures, failure to diagnose a condition, and creating a complication requiring transfusion or operation were discussed with the patient. The patient concurred with the proposed plan, giving informed consent. The patient was taken to the Operating Room, identified as Kristina Murphy and the procedure verified as Diagnostic Laparoscopy with bilateral salpingectyomy. A Time Out was held and the above information confirmed.  After induction of general anesthesia, the patient was placed in modified dorsal lithotomy position where she was prepped, draped, and catheterized in the normal, sterile fashion. A foley catheter was placed..  The cervix was visualized and an intrauterine manipulator was placed. A 2 cm umbilical incision was then performed.and carried down to the fascia.  The fascia was then opened and extended bilaterally.  Peritoneum was then entered.  o vicryl was then placed around the fascia in a circumferential fashion.   The hasson was placed and ancored to the suture.Two 5mm ports were placed under direct visualization.   Normal pelvic anatomy was noted.   Uterus,tubes, ovaries and appendix apperared normal.   The   Posterior culdesac and liver appeared normal.   The anterior culdesac had some adhesions form her CS Both fallopian tubes were identified and removed using the gyrus.  The mesosalpinx was irrigated and was hemostatic.    Following the procedure the umbilical hasson was removed after intra-abdominal carbon  dioxide was expressed. The fascia was reaproximated by tying the 0 vicryl suture.   The 5mm skin incisions were closed with dermabond.  The 10 mm incision was closed with a  subcuticular suture of 3-0 monocryl. The intrauterine manipulator was then removed.  The tenaculum site was hemostatic.   Instrument, sponge, and needle counts were correct prior to abdominal closure and at the conclusion of the case.  Findings: See above Estimated Blood Loss:  Minimal         Drains: none         Total IV Fluids 900 cc ml's.         Specimens: none              Complications:  None; patient tolerated the procedure well.         Disposition: PACU - hemodynamically stable.         Condition: stable

## 2024-08-10 NOTE — Anesthesia Procedure Notes (Signed)
 Procedure Name: Intubation Date/Time: 08/10/2024 5:08 PM  Performed by: Delores Dus, CRNAPre-anesthesia Checklist: Patient identified, Emergency Drugs available, Suction available and Patient being monitored Patient Re-evaluated:Patient Re-evaluated prior to induction Oxygen Delivery Method: Circle system utilized Preoxygenation: Pre-oxygenation with 100% oxygen Induction Type: IV induction Ventilation: Mask ventilation without difficulty Laryngoscope Size: Mac and 4 Grade View: Grade II Tube type: Oral Tube size: 6.5 mm Number of attempts: 1 Airway Equipment and Method: Stylet Placement Confirmation: ETT inserted through vocal cords under direct vision, positive ETCO2 and breath sounds checked- equal and bilateral Secured at: 20 cm Tube secured with: Tape Dental Injury: Teeth and Oropharynx as per pre-operative assessment

## 2024-08-13 ENCOUNTER — Encounter (HOSPITAL_COMMUNITY): Payer: Self-pay | Admitting: Obstetrics and Gynecology

## 2024-08-17 LAB — SURGICAL PATHOLOGY
